# Patient Record
Sex: Female | Born: 1990 | Race: White | Hispanic: No | Marital: Single | Smoking: Current every day smoker
Health system: Southern US, Community
[De-identification: ages and names within clinical notes are randomized; demographics above are authoritative.]

## PROBLEM LIST (undated history)

## (undated) ENCOUNTER — Inpatient Hospital Stay (HOSPITAL_COMMUNITY): Payer: Self-pay

## (undated) DIAGNOSIS — M5430 Sciatica, unspecified side: Secondary | ICD-10-CM

## (undated) DIAGNOSIS — G971 Other reaction to spinal and lumbar puncture: Secondary | ICD-10-CM

## (undated) DIAGNOSIS — T8859XA Other complications of anesthesia, initial encounter: Secondary | ICD-10-CM

## (undated) DIAGNOSIS — T4145XA Adverse effect of unspecified anesthetic, initial encounter: Secondary | ICD-10-CM

## (undated) HISTORY — DX: Other complications of anesthesia, initial encounter: T88.59XA

## (undated) HISTORY — DX: Other reaction to spinal and lumbar puncture: G97.1

## (undated) HISTORY — DX: Adverse effect of unspecified anesthetic, initial encounter: T41.45XA

---
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
## 2013-04-22 ENCOUNTER — Encounter: Payer: Self-pay | Admitting: Obstetrics & Gynecology

## 2013-04-22 ENCOUNTER — Ambulatory Visit (INDEPENDENT_AMBULATORY_CARE_PROVIDER_SITE_OTHER): Payer: Medicaid Other | Admitting: General Practice

## 2013-04-22 DIAGNOSIS — Z3200 Encounter for pregnancy test, result unknown: Secondary | ICD-10-CM

## 2013-04-22 DIAGNOSIS — Z3201 Encounter for pregnancy test, result positive: Secondary | ICD-10-CM

## 2013-04-22 LAB — POCT PREGNANCY, URINE: Preg Test, Ur: POSITIVE — AB

## 2013-05-11 ENCOUNTER — Encounter: Payer: Self-pay | Admitting: Family Medicine

## 2013-05-11 ENCOUNTER — Ambulatory Visit (INDEPENDENT_AMBULATORY_CARE_PROVIDER_SITE_OTHER): Payer: Medicaid Other | Admitting: Family Medicine

## 2013-05-11 VITALS — BP 112/65 | Temp 97.5°F | Ht 64.0 in | Wt 141.5 lb

## 2013-05-11 DIAGNOSIS — Z3490 Encounter for supervision of normal pregnancy, unspecified, unspecified trimester: Secondary | ICD-10-CM

## 2013-05-11 DIAGNOSIS — Z3481 Encounter for supervision of other normal pregnancy, first trimester: Secondary | ICD-10-CM

## 2013-05-11 DIAGNOSIS — O09299 Supervision of pregnancy with other poor reproductive or obstetric history, unspecified trimester: Secondary | ICD-10-CM

## 2013-05-11 LAB — POCT URINALYSIS DIP (DEVICE)
Bilirubin Urine: NEGATIVE
Glucose, UA: NEGATIVE mg/dL
Hgb urine dipstick: NEGATIVE
Nitrite: NEGATIVE
Urobilinogen, UA: 0.2 mg/dL (ref 0.0–1.0)
pH: 8 (ref 5.0–8.0)

## 2013-05-11 NOTE — Progress Notes (Signed)
.    Subjective:    Shawna Michael is a 22 y.o. female being seen today for her obstetrical visit. She is at Unknown gestation. Patient reports nausea, occasional contractions and vomiting. Fetal movement: normal.  Menstrual History: OB History   Grav Para Term Preterm Abortions TAB SAB Ect Mult Living   5 2  2 2  2          Menarche age:  Patient's last menstrual period was 02/12/2013.    The following portions of the patient's history were reviewed and updated as appropriate: allergies, current medications, past family history, past medical history, past social history, past surgical history and problem list.  Review of Systems A comprehensive review of systems was negative except for: Gastrointestinal: positive for nausea and vomiting   Objective:     BP 112/65  Temp(Src) 97.5 F (36.4 C)  Ht 5\' 4"  (1.626 m)  Wt 141 lb 8 oz (64.184 kg)  BMI 24.28 kg/m2  LMP 02/12/2013 Uterine Size:   Pelvic Exam:          Perineum: is normal                Vulva: normal              Vagina:  normal mucosa, normal discharge                     pH:               Cervix: anteverted and no lesions          Wet Prep:               Uterus: normal size             Adnexa: normal adnexa     Bony Pelvis: average     Assessment:    Pregnancy 12 and 4/7 weeks   Plan:    Problem list reviewed and updated. Labs reviewed. AFP3 discussed: requested. Role of ultrasound in pregnancy discussed; fetal survey: requested. Amniocentesis discussed: undecided. Follow up in 4 weeks.

## 2013-05-11 NOTE — Patient Instructions (Addendum)
AFP Maternal This is a routine screen (tests) used to check for fetal abnormalities such as Down syndrome and neural tube defects. Down Syndrome is a chromosomal abnormality, sometimes called Trisomy 70. Neural tube defects are serious birth defects. The brain, spinal cord, or their coverings do not develop completely. Women should be tested in the 15th to 20th week of pregnancy. The msAFP screen involves three or four tests that measure substances found in the blood that make the testing better. During development, AFP levels in fetal blood and amniotic fluid rise until about 12 weeks. The levels then gradually fall until birth. AFP is a protein produce by fetal tissue. AFP crosses the placenta and appears in the maternal blood. A baby with an open neural tube defect has an opening in its spine, head, or abdominal wall that allows higher-than-usual amounts of AFP to pass into the mother's blood. If a screen is positive, more tests are needed to make a diagnosis. These include ultrasound and perhaps amniocentesis (checking the fluid that surrounds the baby). These tests are used to help women and their caregivers make decisions about the management of their pregnancies. In pregnancies where the fetus is carrying the chromosomal defect that results in Down syndrome, the levels of AFP and unconjugated estriol tend to be low and hCG and inhibin A levels high.  PREPARATION FOR TEST Blood is drawn from a vein in your arm usually between the 15th and 20th weeks of pregnancy. Four different tests on your blood are done. These are AFP, hCG, unconjugated estriol, and inhibin A. The combination of tests produces a more accurate result. NORMAL FINDINGS   Adult: less than 40ng/mL or less than 40 mg/L (SI units)  Child younger than1 year: less than 30 ng/mL Ranges are stratified by weeks of gestation and vary among laboratories. Ranges for normal findings may vary among different laboratories and hospitals. You  should always check with your doctor after having lab work or other tests done to discuss the meaning of your test results and whether your values are considered within normal limits. MEANING OF TEST  These are screening tests. Not all fetal abnormalities will give positive test results. Of all women who have positive AFP screening results, only a very small number of them have babies who actually have a neural tube defect or chromosomal abnormality. Your caregiver will go over the test results with you and discuss the importance and meaning of your results, as well as treatment options and the need for additional tests if necessary. OBTAINING THE TEST RESULTS It is your responsibility to obtain your test results. Ask the lab or department performing the test when and how you will get your results. Document Released: 10/14/2004 Document Revised: 12/15/2011 Document Reviewed: 08/26/2008 [facility] Patient Information 2014 [place], [place]. Pregnancy - First Trimester During sexual intercourse, millions of sperm go into the vagina. Only 1 sperm will penetrate and fertilize the female egg while it is in the Fallopian tube. One week later, the fertilized egg implants into the wall of the uterus. An embryo begins to develop into a baby. At 6 to 8 weeks, the eyes and face are formed and the heartbeat can be seen on ultrasound. At the end of 12 weeks (first trimester), all the baby's organs are formed. Now that you are pregnant, you will want to do everything you can to have a healthy baby. Two of the most important things are to get good prenatal care and follow your caregiver's instructions. Prenatal care is  all the medical care you receive before the baby's birth. It is given to prevent, find, and treat problems during the pregnancy and childbirth. PRENATAL EXAMS  During prenatal visits, your weight, blood pressure, and urine are checked. This is done to make sure you are healthy and progressing normally during  the pregnancy.  A pregnant woman should gain 25 to 35 pounds during the pregnancy. However, if you are overweight or underweight, your caregiver will advise you regarding your weight.  Your caregiver will ask and answer questions for you.  Blood work, cervical cultures, other necessary tests, and a Pap test are done during your prenatal exams. These tests are done to check on your health and the probable health of your baby. Tests are strongly recommended and done for HIV with your permission. This is the virus that causes AIDS. These tests are done because medicines can be given to help prevent your baby from being born with this infection should you have been infected without knowing it. Blood work is also used to find out your blood type, previous infections, and follow your blood levels (hemoglobin).  Low hemoglobin (anemia) is common during pregnancy. Iron and vitamins are given to help prevent this. Later in the pregnancy, blood tests for diabetes will be done along with any other tests if any problems develop.  You may need other tests to make sure you and the baby are doing well. CHANGES DURING THE FIRST TRIMESTER  Your body goes through many changes during pregnancy. They vary from person to person. Talk to your caregiver about changes you notice and are concerned about. Changes can include:  Your menstrual period stops.  The egg and sperm carry the genes that determine what you look like. Genes from you and your partner are forming a baby. The female genes determine whether the baby is a boy or a girl.  Your body increases in girth and you may feel bloated.  Feeling sick to your stomach (nauseous) and throwing up (vomiting). If the vomiting is uncontrollable, call your caregiver.  Your breasts will begin to enlarge and become tender.  Your nipples may stick out more and become darker.  The need to urinate more. Painful urination may mean you have a bladder infection.  Tiring  easily.  Loss of appetite.  Cravings for certain kinds of food.  At first, you may gain or lose a couple of pounds.  You may have changes in your emotions from day to day (excited to be pregnant or concerned something may go wrong with the pregnancy and baby).  You may have more vivid and strange dreams. HOME CARE INSTRUCTIONS   It is very important to avoid all smoking, alcohol and non-prescribed drugs during your pregnancy. These affect the formation and growth of the baby. Avoid chemicals while pregnant to ensure the delivery of a healthy infant.  Start your prenatal visits by the 12th week of pregnancy. They are usually scheduled monthly at first, then more often in the last 2 months before delivery. Keep your caregiver's appointments. Follow your caregiver's instructions regarding medicine use, blood and lab tests, exercise, and diet.  During pregnancy, you are providing food for you and your baby. Eat regular, well-balanced meals. Choose foods such as meat, fish, milk and other low fat dairy products, vegetables, fruits, and whole-grain breads and cereals. Your caregiver will tell you of the ideal weight gain.  You can help morning sickness by keeping soda crackers at the bedside. Eat a couple before arising  in the morning. You may want to use the crackers without salt on them.  Eating 4 to 5 small meals rather than 3 large meals a day also may help the nausea and vomiting.  Drinking liquids between meals instead of during meals also seems to help nausea and vomiting.  A physical sexual relationship may be continued throughout pregnancy if there are no other problems. Problems may be early (premature) leaking of amniotic fluid from the membranes, vaginal bleeding, or belly (abdominal) pain.  Exercise regularly if there are no restrictions. Check with your caregiver or physical therapist if you are unsure of the safety of some of your exercises. Greater weight gain will occur in the  last 2 trimesters of pregnancy. Exercising will help:  Control your weight.  Keep you in shape.  Prepare you for labor and delivery.  Help you lose your pregnancy weight after you deliver your baby.  Wear a good support or jogging bra for breast tenderness during pregnancy. This may help if worn during sleep too.  Ask when prenatal classes are available. Begin classes when they are offered.  Do not use hot tubs, steam rooms, or saunas.  Wear your seat belt when driving. This protects you and your baby if you are in an accident.  Avoid raw meat, uncooked cheese, cat litter boxes, and soil used by cats throughout the pregnancy. These carry germs that can cause birth defects in the baby.  The first trimester is a good time to visit your dentist for your dental health. Getting your teeth cleaned is okay. Use a softer toothbrush and brush gently during pregnancy.  Ask for help if you have financial, counseling, or nutritional needs during pregnancy. Your caregiver will be able to offer counseling for these needs as well as refer you for other special needs.  Do not take any medicines or herbs unless told by your caregiver.  Inform your caregiver if there is any mental or physical domestic violence.  Make a list of emergency phone numbers of family, friends, hospital, and police and fire departments.  Write down your questions. Take them to your prenatal visit.  Do not douche.  Do not cross your legs.  If you have to stand for long periods of time, rotate you feet or take small steps in a circle.  You may have more vaginal secretions that may require a sanitary pad. Do not use tampons or scented sanitary pads. MEDICINES AND DRUG USE IN PREGNANCY  Take prenatal vitamins as directed. The vitamin should contain 1 milligram of folic acid. Keep all vitamins out of reach of children. Only a couple vitamins or tablets containing iron may be fatal to a baby or young child when  ingested.  Avoid use of all medicines, including herbs, over-the-counter medicines, not prescribed or suggested by your caregiver. Only take over-the-counter or prescription medicines for pain, discomfort, or fever as directed by your caregiver. Do not use aspirin, ibuprofen, or naproxen unless directed by your caregiver.  Let your caregiver also know about herbs you may be using.  Alcohol is related to a number of birth defects. This includes fetal alcohol syndrome. All alcohol, in any form, should be avoided completely. Smoking will cause low birth rate and premature babies.  Street or illegal drugs are very harmful to the baby. They are absolutely forbidden. A baby born to an addicted mother will be addicted at birth. The baby will go through the same withdrawal an adult does.  Let your caregiver know  about any medicines that you have to take and for what reason you take them. SEEK MEDICAL CARE IF:  You have any concerns or worries during your pregnancy. It is better to call with your questions if you feel they cannot wait, rather than worry about them. SEEK IMMEDIATE MEDICAL CARE IF:   An unexplained oral temperature above 102 F (38.9 C) develops, or as your caregiver suggests.  You have leaking of fluid from the vagina (birth canal). If leaking membranes are suspected, take your temperature and inform your caregiver of this when you call.  There is vaginal spotting or bleeding. Notify your caregiver of the amount and how many pads are used.  You develop a bad smelling vaginal discharge with a change in the color.  You continue to feel sick to your stomach (nauseated) and have no relief from remedies suggested. You vomit blood or coffee ground-like materials.  You lose more than 2 pounds of weight in 1 week.  You gain more than 2 pounds of weight in 1 week and you notice swelling of your face, hands, feet, or legs.  You gain 5 pounds or more in 1 week (even if you do not have  swelling of your hands, face, legs, or feet).  You get exposed to [place] measles and have never had them.  You are exposed to fifth disease or chickenpox.  You develop belly (abdominal) pain. Round ligament discomfort is a common non-cancerous (benign) cause of abdominal pain in pregnancy. Your caregiver still must evaluate this.  You develop headache, fever, diarrhea, pain with urination, or shortness of breath.  You fall or are in a car accident or have any kind of trauma.  There is mental or physical violence in your home. Document Released: 09/16/2001 Document Revised: 06/16/2012 Document Reviewed: 03/20/2009 [facility] Patient Information 2014 [place], [place].  Pregnancy - First Trimester During sexual intercourse, millions of sperm go into the vagina. Only 1 sperm will penetrate and fertilize the female egg while it is in the Fallopian tube. One week later, the fertilized egg implants into the wall of the uterus. An embryo begins to develop into a baby. At 6 to 8 weeks, the eyes and face are formed and the heartbeat can be seen on ultrasound. At the end of 12 weeks (first trimester), all the baby's organs are formed. Now that you are pregnant, you will want to do everything you can to have a healthy baby. Two of the most important things are to get good prenatal care and follow your caregiver's instructions. Prenatal care is all the medical care you receive before the baby's birth. It is given to prevent, find, and treat problems during the pregnancy and childbirth. PRENATAL EXAMS  During prenatal visits, your weight, blood pressure, and urine are checked. This is done to make sure you are healthy and progressing normally during the pregnancy.  A pregnant woman should gain 25 to 35 pounds during the pregnancy. However, if you are overweight or underweight, your caregiver will advise you regarding your weight.  Your caregiver will ask and answer questions for you.  Blood work, cervical  cultures, other necessary tests, and a Pap test are done during your prenatal exams. These tests are done to check on your health and the probable health of your baby. Tests are strongly recommended and done for HIV with your permission. This is the virus that causes AIDS. These tests are done because medicines can be given to help prevent your baby from being born  with this infection should you have been infected without knowing it. Blood work is also used to find out your blood type, previous infections, and follow your blood levels (hemoglobin).  Low hemoglobin (anemia) is common during pregnancy. Iron and vitamins are given to help prevent this. Later in the pregnancy, blood tests for diabetes will be done along with any other tests if any problems develop.  You may need other tests to make sure you and the baby are doing well. CHANGES DURING THE FIRST TRIMESTER  Your body goes through many changes during pregnancy. They vary from person to person. Talk to your caregiver about changes you notice and are concerned about. Changes can include:  Your menstrual period stops.  The egg and sperm carry the genes that determine what you look like. Genes from you and your partner are forming a baby. The female genes determine whether the baby is a boy or a girl.  Your body increases in girth and you may feel bloated.  Feeling sick to your stomach (nauseous) and throwing up (vomiting). If the vomiting is uncontrollable, call your caregiver.  Your breasts will begin to enlarge and become tender.  Your nipples may stick out more and become darker.  The need to urinate more. Painful urination may mean you have a bladder infection.  Tiring easily.  Loss of appetite.  Cravings for certain kinds of food.  At first, you may gain or lose a couple of pounds.  You may have changes in your emotions from day to day (excited to be pregnant or concerned something may go wrong with the pregnancy and  baby).  You may have more vivid and strange dreams. HOME CARE INSTRUCTIONS   It is very important to avoid all smoking, alcohol and non-prescribed drugs during your pregnancy. These affect the formation and growth of the baby. Avoid chemicals while pregnant to ensure the delivery of a healthy infant.  Start your prenatal visits by the 12th week of pregnancy. They are usually scheduled monthly at first, then more often in the last 2 months before delivery. Keep your caregiver's appointments. Follow your caregiver's instructions regarding medicine use, blood and lab tests, exercise, and diet.  During pregnancy, you are providing food for you and your baby. Eat regular, well-balanced meals. Choose foods such as meat, fish, milk and other low fat dairy products, vegetables, fruits, and whole-grain breads and cereals. Your caregiver will tell you of the ideal weight gain.  You can help morning sickness by keeping soda crackers at the bedside. Eat a couple before arising in the morning. You may want to use the crackers without salt on them.  Eating 4 to 5 small meals rather than 3 large meals a day also may help the nausea and vomiting.  Drinking liquids between meals instead of during meals also seems to help nausea and vomiting.  A physical sexual relationship may be continued throughout pregnancy if there are no other problems. Problems may be early (premature) leaking of amniotic fluid from the membranes, vaginal bleeding, or belly (abdominal) pain.  Exercise regularly if there are no restrictions. Check with your caregiver or physical therapist if you are unsure of the safety of some of your exercises. Greater weight gain will occur in the last 2 trimesters of pregnancy. Exercising will help:  Control your weight.  Keep you in shape.  Prepare you for labor and delivery.  Help you lose your pregnancy weight after you deliver your baby.  Wear a good support or  jogging bra for breast  tenderness during pregnancy. This may help if worn during sleep too.  Ask when prenatal classes are available. Begin classes when they are offered.  Do not use hot tubs, steam rooms, or saunas.  Wear your seat belt when driving. This protects you and your baby if you are in an accident.  Avoid raw meat, uncooked cheese, cat litter boxes, and soil used by cats throughout the pregnancy. These carry germs that can cause birth defects in the baby.  The first trimester is a good time to visit your dentist for your dental health. Getting your teeth cleaned is okay. Use a softer toothbrush and brush gently during pregnancy.  Ask for help if you have financial, counseling, or nutritional needs during pregnancy. Your caregiver will be able to offer counseling for these needs as well as refer you for other special needs.  Do not take any medicines or herbs unless told by your caregiver.  Inform your caregiver if there is any mental or physical domestic violence.  Make a list of emergency phone numbers of family, friends, hospital, and police and fire departments.  Write down your questions. Take them to your prenatal visit.  Do not douche.  Do not cross your legs.  If you have to stand for long periods of time, rotate you feet or take small steps in a circle.  You may have more vaginal secretions that may require a sanitary pad. Do not use tampons or scented sanitary pads. MEDICINES AND DRUG USE IN PREGNANCY  Take prenatal vitamins as directed. The vitamin should contain 1 milligram of folic acid. Keep all vitamins out of reach of children. Only a couple vitamins or tablets containing iron may be fatal to a baby or young child when ingested.  Avoid use of all medicines, including herbs, over-the-counter medicines, not prescribed or suggested by your caregiver. Only take over-the-counter or prescription medicines for pain, discomfort, or fever as directed by your caregiver. Do not use aspirin,  ibuprofen, or naproxen unless directed by your caregiver.  Let your caregiver also know about herbs you may be using.  Alcohol is related to a number of birth defects. This includes fetal alcohol syndrome. All alcohol, in any form, should be avoided completely. Smoking will cause low birth rate and premature babies.  Street or illegal drugs are very harmful to the baby. They are absolutely forbidden. A baby born to an addicted mother will be addicted at birth. The baby will go through the same withdrawal an adult does.  Let your caregiver know about any medicines that you have to take and for what reason you take them. SEEK MEDICAL CARE IF:  You have any concerns or worries during your pregnancy. It is better to call with your questions if you feel they cannot wait, rather than worry about them. SEEK IMMEDIATE MEDICAL CARE IF:   An unexplained oral temperature above 102 F (38.9 C) develops, or as your caregiver suggests.  You have leaking of fluid from the vagina (birth canal). If leaking membranes are suspected, take your temperature and inform your caregiver of this when you call.  There is vaginal spotting or bleeding. Notify your caregiver of the amount and how many pads are used.  You develop a bad smelling vaginal discharge with a change in the color.  You continue to feel sick to your stomach (nauseated) and have no relief from remedies suggested. You vomit blood or coffee ground-like materials.  You lose more than 2  pounds of weight in 1 week.  You gain more than 2 pounds of weight in 1 week and you notice swelling of your face, hands, feet, or legs.  You gain 5 pounds or more in 1 week (even if you do not have swelling of your hands, face, legs, or feet).  You get exposed to [place] measles and have never had them.  You are exposed to fifth disease or chickenpox.  You develop belly (abdominal) pain. Round ligament discomfort is a common non-cancerous (benign) cause of  abdominal pain in pregnancy. Your caregiver still must evaluate this.  You develop headache, fever, diarrhea, pain with urination, or shortness of breath.  You fall or are in a car accident or have any kind of trauma.  There is mental or physical violence in your home. Document Released: 09/16/2001 Document Revised: 06/16/2012 Document Reviewed: 03/20/2009 [facility] Patient Information 2014 [place], [place].  Thank you for enrolling in MyChart. Please follow the instructions below to securely access your online medical record. MyChart allows you to send messages to your doctor, view your test results, manage appointments, and more.   How Do I Sign Up? 1. In your Internet browser, go to [employer] and enter [email or web address]. 2. Click on the Sign Up Now link in the Sign In box. You will see the New Member Sign Up page. 3. Enter your MyChart Access Code exactly as it appears below. You will not need to use this code after you've completed the sign-up process. If you do not sign up before the expiration date, you must request a new code. MyChart Access Code: [email or web address] Expires: 06/10/2013 10:53 AM  4. Enter your Social Security Number ([email or web address]) and Date of Birth (mm/dd/yyyy) as indicated and click Submit. You will be taken to the next sign-up page. 5. Create a MyChart ID. This will be your MyChart login ID and cannot be changed, so think of one that is secure and easy to remember. 6. Create a MyChart password. You can change your password at any time. 7. Enter your Password Reset Question and Answer. This can be used at a later time if you forget your password.  8. Enter your e-mail address. You will receive e-mail notification when new information is available in MyChart. 9. Click Sign Up. You can now view your medical record.   Additional Information Remember, MyChart is NOT to be used for urgent needs. For medical emergencies, dial 911.

## 2013-05-11 NOTE — Progress Notes (Signed)
P=[id number],  Here for new ob.  Went to [facility] in late June/early July had first ultrasound because having severe pelvic pain. Was taking zofran, has ran out and would like a refill for continued n&V. C/o cramping everyday, less than when went to [facility]. Given new patient information and discussed bmi and appropriate weight gain.

## 2013-05-11 NOTE — Progress Notes (Signed)
I spoke with and examined patient and agree with resident's note and plan of care.  Tawana Scale, MD OB Fellow 05/11/2013 11:06 AM  Shawna Michael is a 22 y.o. [id number] at [redacted]w[redacted]d by LMP but unsure of dating here for ROB visit.  Plan to do dating [place] after having genetic abnormalities with last infant. Will do quad screen and additional testing based on results.  Discussed with Patient:  - New OB labs from last visit were wnl. - RTC for any VB, regular, painful cramps/ctxs occurring at a rate of >2/10 min, fever (100.5 or higher), n/v/d, any pain that is unresolving or worsening. - Routine precautions(SAB, depression, infection s/s) - RTC in 4 weeks for next appt.  To Do: 1. Dating [place], call for results and plan for quad screen

## 2013-05-12 LAB — OBSTETRIC PANEL
Basophils Absolute: 0 10*3/uL (ref 0.0–0.1)
Basophils Relative: 0 % (ref 0–1)
Eosinophils Absolute: 0 10*3/uL (ref 0.0–0.7)
Hemoglobin: 11.8 g/dL — ABNORMAL LOW (ref 12.0–15.0)
Hepatitis B Surface Ag: NEGATIVE
MCH: 30 pg (ref 26.0–34.0)
MCHC: 34.4 g/dL (ref 30.0–36.0)
Neutro Abs: 6.2 10*3/uL (ref 1.7–7.7)
Neutrophils Relative %: 73 % (ref 43–77)
Platelets: 243 10*3/uL (ref 150–400)
RDW: 14 % (ref 11.5–15.5)

## 2013-05-12 LAB — GC/CHLAMYDIA PROBE AMP: GC Probe RNA: NEGATIVE

## 2013-05-13 ENCOUNTER — Ambulatory Visit (HOSPITAL_COMMUNITY)
Admission: RE | Admit: 2013-05-13 | Discharge: 2013-05-13 | Disposition: A | Discharge status: Home / Self Care | Payer: Medicaid Other | Source: Ambulatory Visit | Attending: Family Medicine | Admitting: Family Medicine

## 2013-05-13 ENCOUNTER — Other Ambulatory Visit: Payer: Self-pay | Admitting: Family Medicine

## 2013-05-13 ENCOUNTER — Encounter: Payer: Self-pay | Admitting: *Deleted

## 2013-05-13 DIAGNOSIS — Z3490 Encounter for supervision of normal pregnancy, unspecified, unspecified trimester: Secondary | ICD-10-CM

## 2013-05-13 DIAGNOSIS — O09299 Supervision of pregnancy with other poor reproductive or obstetric history, unspecified trimester: Secondary | ICD-10-CM | POA: Insufficient documentation

## 2013-05-13 DIAGNOSIS — Z3481 Encounter for supervision of other normal pregnancy, first trimester: Secondary | ICD-10-CM

## 2013-05-13 DIAGNOSIS — O26849 Uterine size-date discrepancy, unspecified trimester: Secondary | ICD-10-CM | POA: Insufficient documentation

## 2013-05-13 DIAGNOSIS — Z3689 Encounter for other specified antenatal screening: Secondary | ICD-10-CM | POA: Insufficient documentation

## 2013-05-16 LAB — CULTURE, OB URINE: Colony Count: 85000

## 2013-05-18 ENCOUNTER — Telehealth: Payer: Self-pay | Admitting: Family Medicine

## 2013-05-18 DIAGNOSIS — Z3490 Encounter for supervision of normal pregnancy, unspecified, unspecified trimester: Secondary | ICD-10-CM

## 2013-05-20 ENCOUNTER — Telehealth: Payer: Self-pay | Admitting: *Deleted

## 2013-05-20 NOTE — Telephone Encounter (Signed)
05/18/2013 7:56 AM Minta Balsam, MD [facility] Admin Pool Patient Calls    Comment: Howdy,  Please call pt and inform her that her due date is 10/22/2012 and she needs to come to lab to have the tests drawn. Quad screen entered. Please have her come ASAP to have labs drawn and schedule her follow up. Also entered Anatomy scan for 19 weeks.   Cheers,  Hinda Lenis

## 2013-05-20 NOTE — Telephone Encounter (Signed)
Called Shawna Michael and we discussed that she has already had prenatal labs drawn, needs quad screen drawn, had ultrasound done she states which they told her changed her due date- . She thinks she needs to come in sooner because she has had history of problems with pregnancy and may need to make some decisions if quad is abnormal.  Informed her we would clarify with her doctor and call her back and schedule her for quad asap.

## 2013-05-20 NOTE — Telephone Encounter (Signed)
Pt left message stating that she had [place] on 8/8 which showed that she is further along in her pregnancy than was previously thought. She wants to know if she needs a sooner appt.  I called pt and informed her that appt has been made for her on 8/21 @ 1245 for f/u prenatal visit. Her due date had been changed to 10/22/13 based on the [place]. She will also need f/u US to further evaluate some anatomy which was not visualized well last week. The appt has been made for 05/30/13 @ 1:00. She would like to get her Quad screen done as soon as possible because of past OB history. She will come in on 8/18 @ 1000 for Quad only. Pt agreed and voiced understanding.

## 2013-05-20 NOTE — Telephone Encounter (Signed)
Sent to [employer], copied and sent to clinical pool

## 2013-05-23 ENCOUNTER — Other Ambulatory Visit (INDEPENDENT_AMBULATORY_CARE_PROVIDER_SITE_OTHER): Payer: Medicaid Other

## 2013-05-23 DIAGNOSIS — O09299 Supervision of pregnancy with other poor reproductive or obstetric history, unspecified trimester: Secondary | ICD-10-CM

## 2013-05-26 ENCOUNTER — Encounter: Payer: Self-pay | Admitting: Obstetrics and Gynecology

## 2013-05-26 ENCOUNTER — Ambulatory Visit (INDEPENDENT_AMBULATORY_CARE_PROVIDER_SITE_OTHER): Payer: Medicaid Other | Admitting: Obstetrics and Gynecology

## 2013-05-26 VITALS — BP 96/59 | Wt 143.5 lb

## 2013-05-26 DIAGNOSIS — O26892 Other specified pregnancy related conditions, second trimester: Secondary | ICD-10-CM

## 2013-05-26 DIAGNOSIS — R300 Dysuria: Secondary | ICD-10-CM

## 2013-05-26 DIAGNOSIS — O09299 Supervision of pregnancy with other poor reproductive or obstetric history, unspecified trimester: Secondary | ICD-10-CM

## 2013-05-26 DIAGNOSIS — N390 Urinary tract infection, site not specified: Secondary | ICD-10-CM

## 2013-05-26 DIAGNOSIS — O9989 Other specified diseases and conditions complicating pregnancy, childbirth and the puerperium: Secondary | ICD-10-CM

## 2013-05-26 DIAGNOSIS — O09292 Supervision of pregnancy with other poor reproductive or obstetric history, second trimester: Secondary | ICD-10-CM | POA: Insufficient documentation

## 2013-05-26 LAB — POCT URINALYSIS DIP (DEVICE)
Bilirubin Urine: NEGATIVE
Glucose, UA: NEGATIVE mg/dL
Hgb urine dipstick: NEGATIVE
Ketones, ur: NEGATIVE mg/dL
Nitrite: NEGATIVE
Specific Gravity, Urine: 1.015 (ref 1.005–1.030)
pH: 5.5 (ref 5.0–8.0)

## 2013-05-26 NOTE — Patient Instructions (Signed)
Pregnancy - Second Trimester The second trimester of pregnancy (3 to 6 months) is a period of rapid growth for you and your baby. At the end of the sixth month, your baby is about 9 inches long and weighs 1 1/2 pounds. You will begin to feel the baby move between 18 and 20 weeks of the pregnancy. This is called quickening. Weight gain is faster. A clear fluid (colostrum) may leak out of your breasts. You may feel small contractions of the womb (uterus). This is known as false labor or Braxton-Hicks contractions. This is like a practice for labor when the baby is ready to be born. Usually, the problems with morning sickness have usually passed by the end of your first trimester. Some women develop small dark blotches (called cholasma, mask of pregnancy) on their face that usually goes away after the baby is born. Exposure to the sun makes the blotches worse. Acne may also develop in some pregnant women and pregnant women who have acne, may find that it goes away. PRENATAL EXAMS  Blood work may continue to be done during prenatal exams. These tests are done to check on your health and the probable health of your baby. Blood work is used to follow your blood levels (hemoglobin). Anemia (low hemoglobin) is common during pregnancy. Iron and vitamins are given to help prevent this. You will also be checked for diabetes between 24 and 28 weeks of the pregnancy. Some of the previous blood tests may be repeated.  The size of the uterus is measured during each visit. This is to make sure that the baby is continuing to grow properly according to the dates of the pregnancy.  Your blood pressure is checked every prenatal visit. This is to make sure you are not getting toxemia.  Your urine is checked to make sure you do not have an infection, diabetes or protein in the urine.  Your weight is checked often to make sure gains are happening at the suggested rate. This is to ensure that both you and your baby are  growing normally.  Sometimes, an ultrasound is performed to confirm the proper growth and development of the baby. This is a test which bounces harmless sound waves off the baby so your caregiver can more accurately determine due dates. Sometimes, a test is done on the amniotic fluid surrounding the baby. This test is called an amniocentesis. The amniotic fluid is obtained by sticking a needle into the belly (abdomen). This is done to check the chromosomes in instances where there is a concern about possible genetic problems with the baby. It is also sometimes done near the end of pregnancy if an early delivery is required. In this case, it is done to help make sure the baby's lungs are mature enough for the baby to live outside of the womb. CHANGES OCCURING IN THE SECOND TRIMESTER OF PREGNANCY Your body goes through many changes during pregnancy. They vary from person to person. Talk to your caregiver about changes you notice that you are concerned about.  During the second trimester, you will likely have an increase in your appetite. It is normal to have cravings for certain foods. This varies from person to person and pregnancy to pregnancy.  Your lower abdomen will begin to bulge.  You may have to urinate more often because the uterus and baby are pressing on your bladder. It is also common to get more bladder infections during pregnancy. You can help this by drinking lots of fluids   and emptying your bladder before and after intercourse.  You may begin to get stretch marks on your hips, abdomen, and breasts. These are normal changes in the body during pregnancy. There are no exercises or medicines to take that prevent this change.  You may begin to develop swollen and bulging veins (varicose veins) in your legs. Wearing support hose, elevating your feet for 15 minutes, 3 to 4 times a day and limiting salt in your diet helps lessen the problem.  Heartburn may develop as the uterus grows and  pushes up against the stomach. Antacids recommended by your caregiver helps with this problem. Also, eating smaller meals 4 to 5 times a day helps.  Constipation can be treated with a stool softener or adding bulk to your diet. Drinking lots of fluids, and eating vegetables, fruits, and whole grains are helpful.  Exercising is also helpful. If you have been very active up until your pregnancy, most of these activities can be continued during your pregnancy. If you have been less active, it is helpful to start an exercise program such as walking.  Hemorrhoids may develop at the end of the second trimester. Warm sitz baths and hemorrhoid cream recommended by your caregiver helps hemorrhoid problems.  Backaches may develop during this time of your pregnancy. Avoid heavy lifting, wear low heal shoes, and practice good posture to help with backache problems.  Some pregnant women develop tingling and numbness of their hand and fingers because of swelling and tightening of ligaments in the wrist (carpel tunnel syndrome). This goes away after the baby is born.  As your breasts enlarge, you may have to get a bigger bra. Get a comfortable, cotton, support bra. Do not get a nursing bra until the last month of the pregnancy if you will be nursing the baby.  You may get a dark line from your belly button to the pubic area called the linea nigra.  You may develop rosy cheeks because of increase blood flow to the face.  You may develop spider looking lines of the face, neck, arms, and chest. These go away after the baby is born. HOME CARE INSTRUCTIONS   It is extremely important to avoid all smoking, herbs, alcohol, and unprescribed drugs during your pregnancy. These chemicals affect the formation and growth of the baby. Avoid these chemicals throughout the pregnancy to ensure the delivery of a healthy infant.  Most of your home care instructions are the same as suggested for the first trimester of your  pregnancy. Keep your caregiver's appointments. Follow your caregiver's instructions regarding medicine use, exercise, and diet.  During pregnancy, you are providing food for you and your baby. Continue to eat regular, well-balanced meals. Choose foods such as meat, fish, milk and other low fat dairy products, vegetables, fruits, and whole-grain breads and cereals. Your caregiver will tell you of the ideal weight gain.  A physical sexual relationship may be continued up until near the end of pregnancy if there are no other problems. Problems could include early (premature) leaking of amniotic fluid from the membranes, vaginal bleeding, abdominal pain, or other medical or pregnancy problems.  Exercise regularly if there are no restrictions. Check with your caregiver if you are unsure of the safety of some of your exercises. The greatest weight gain will occur in the last 2 trimesters of pregnancy. Exercise will help you:  Control your weight.  Get you in shape for labor and delivery.  Lose weight after you have the baby.  Wear   a good support or jogging bra for breast tenderness during pregnancy. This may help if worn during sleep. Pads or tissues may be used in the bra if you are leaking colostrum.  Do not use hot tubs, steam rooms or saunas throughout the pregnancy.  Wear your seat belt at all times when driving. This protects you and your baby if you are in an accident.  Avoid raw meat, uncooked cheese, cat litter boxes, and soil used by cats. These carry germs that can cause birth defects in the baby.  The second trimester is also a good time to visit your dentist for your dental health if this has not been done yet. Getting your teeth cleaned is okay. Use a soft toothbrush. Brush gently during pregnancy.  It is easier to leak urine during pregnancy. Tightening up and strengthening the pelvic muscles will help with this problem. Practice stopping your urination while you are going to the  bathroom. These are the same muscles you need to strengthen. It is also the muscles you would use as if you were trying to stop from passing gas. You can practice tightening these muscles up 10 times a set and repeating this about 3 times per day. Once you know what muscles to tighten up, do not perform these exercises during urination. It is more likely to contribute to an infection by backing up the urine.  Ask for help if you have financial, counseling, or nutritional needs during pregnancy. Your caregiver will be able to offer counseling for these needs as well as refer you for other special needs.  Your skin may become oily. If so, wash your face with mild soap, use non-greasy moisturizer and oil or cream based makeup. MEDICINES AND DRUG USE IN PREGNANCY  Take prenatal vitamins as directed. The vitamin should contain 1 milligram of folic acid. Keep all vitamins out of reach of children. Only a couple vitamins or tablets containing iron may be fatal to a baby or young child when ingested.  Avoid use of all medicines, including herbs, over-the-counter medicines, not prescribed or suggested by your caregiver. Only take over-the-counter or prescription medicines for pain, discomfort, or fever as directed by your caregiver. Do not use aspirin.  Let your caregiver also know about herbs you may be using.  Alcohol is related to a number of birth defects. This includes fetal alcohol syndrome. All alcohol, in any form, should be avoided completely. Smoking will cause low birth rate and premature babies.  Street or illegal drugs are very harmful to the baby. They are absolutely forbidden. A baby born to an addicted mother will be addicted at birth. The baby will go through the same withdrawal an adult does. SEEK MEDICAL CARE IF:  You have any concerns or worries during your pregnancy. It is better to call with your questions if you feel they cannot wait, rather than worry about them. SEEK IMMEDIATE  MEDICAL CARE IF:   An unexplained oral temperature above 102 F (38.9 C) develops, or as your caregiver suggests.  You have leaking of fluid from the vagina (birth canal). If leaking membranes are suspected, take your temperature and tell your caregiver of this when you call.  There is vaginal spotting, bleeding, or passing clots. Tell your caregiver of the amount and how many pads are used. Light spotting in pregnancy is common, especially following intercourse.  You develop a bad smelling vaginal discharge with a change in the color from clear to white.  You continue to feel   sick to your stomach (nauseated) and have no relief from remedies suggested. You vomit blood or coffee ground-like materials.  You lose more than 2 pounds of weight or gain more than 2 pounds of weight over 1 week, or as suggested by your caregiver.  You notice swelling of your face, hands, feet, or legs.  You get exposed to German measles and have never had them.  You are exposed to fifth disease or chickenpox.  You develop belly (abdominal) pain. Round ligament discomfort is a common non-cancerous (benign) cause of abdominal pain in pregnancy. Your caregiver still must evaluate you.  You develop a bad headache that does not go away.  You develop fever, diarrhea, pain with urination, or shortness of breath.  You develop visual problems, blurry, or double vision.  You fall or are in a car accident or any kind of trauma.  There is mental or physical violence at home. Document Released: 09/16/2001 Document Revised: 06/16/2012 Document Reviewed: 03/21/2009 [employer] Patient Information 2014 [employer].  

## 2013-05-26 NOTE — Progress Notes (Signed)
Pulse: 78 Pt states that she has a uti. She has some abx at home so she took one.

## 2013-05-26 NOTE — Progress Notes (Signed)
Dysuria x 1 d, no systemic sx. Took 1 Keflex left from when she had UTI in prior pregnancy. Urine dip>tr LE. C&S sent. Meanwhile will continue Keflex 500 qid.[place] reviewed> F/U anat scheduled.

## 2013-05-28 LAB — URINE CULTURE: Colony Count: 35000

## 2013-05-30 ENCOUNTER — Ambulatory Visit (HOSPITAL_COMMUNITY)
Admission: RE | Admit: 2013-05-30 | Discharge: 2013-05-30 | Disposition: A | Discharge status: Home / Self Care | Payer: Medicaid Other | Source: Ambulatory Visit | Attending: Family Medicine | Admitting: Family Medicine

## 2013-05-30 DIAGNOSIS — Z3689 Encounter for other specified antenatal screening: Secondary | ICD-10-CM | POA: Insufficient documentation

## 2013-05-30 DIAGNOSIS — Z3490 Encounter for supervision of normal pregnancy, unspecified, unspecified trimester: Secondary | ICD-10-CM

## 2013-05-31 MED ORDER — CEPHALEXIN 500 MG PO CAPS: 500.0000 mg | ORAL_CAPSULE | Freq: Three times a day (TID) | ORAL | Status: DC

## 2013-05-31 NOTE — Addendum Note (Signed)
Addended by: Danae Orleans on: 05/31/2013 07:39 PM   Modules accepted: Orders

## 2013-06-02 ENCOUNTER — Telehealth: Payer: Self-pay | Admitting: General Practice

## 2013-06-02 DIAGNOSIS — N390 Urinary tract infection, site not specified: Secondary | ICD-10-CM

## 2013-06-02 MED ORDER — CEPHALEXIN 500 MG PO CAPS: 500.0000 mg | ORAL_CAPSULE | Freq: Four times a day (QID) | ORAL | Status: DC

## 2013-06-02 NOTE — Telephone Encounter (Signed)
Called patient and informed her of medication and asked what pharmacy she would like to use. Patient stated the [facility] [place]. Told patient I would send Rx in now. Patient verbalized understanding and had no further questions

## 2013-06-02 NOTE — Telephone Encounter (Signed)
Message copied by Kathee Delton on Thu Jun 02, 2013  5:00 PM ------      Message from: POE, DEIRDRE C      Created: Tue May 31, 2013  7:34 PM       This pt is taking some Keflex she had in the past. Her culture is positive. I tried to send Keflex 500 qid x 10 days but message said it won't e prescribe. Please call and see if the Rx went through. ------

## 2013-06-03 ENCOUNTER — Encounter: Payer: Self-pay | Admitting: *Deleted

## 2013-06-03 DIAGNOSIS — O09299 Supervision of pregnancy with other poor reproductive or obstetric history, unspecified trimester: Secondary | ICD-10-CM

## 2013-06-05 ENCOUNTER — Inpatient Hospital Stay (HOSPITAL_COMMUNITY)
Admission: AD | Admit: 2013-06-05 | Discharge: 2013-06-05 | Disposition: A | Discharge status: Home / Self Care | Payer: Medicaid Other | Source: Ambulatory Visit | Attending: Obstetrics & Gynecology | Admitting: Obstetrics & Gynecology

## 2013-06-05 ENCOUNTER — Encounter (HOSPITAL_COMMUNITY): Payer: Self-pay | Admitting: *Deleted

## 2013-06-05 DIAGNOSIS — A499 Bacterial infection, unspecified: Secondary | ICD-10-CM | POA: Insufficient documentation

## 2013-06-05 DIAGNOSIS — B9689 Other specified bacterial agents as the cause of diseases classified elsewhere: Secondary | ICD-10-CM | POA: Insufficient documentation

## 2013-06-05 DIAGNOSIS — O368190 Decreased fetal movements, unspecified trimester, not applicable or unspecified: Secondary | ICD-10-CM | POA: Insufficient documentation

## 2013-06-05 DIAGNOSIS — O09299 Supervision of pregnancy with other poor reproductive or obstetric history, unspecified trimester: Secondary | ICD-10-CM

## 2013-06-05 DIAGNOSIS — N760 Acute vaginitis: Secondary | ICD-10-CM | POA: Insufficient documentation

## 2013-06-05 DIAGNOSIS — R109 Unspecified abdominal pain: Secondary | ICD-10-CM | POA: Insufficient documentation

## 2013-06-05 DIAGNOSIS — O209 Hemorrhage in early pregnancy, unspecified: Secondary | ICD-10-CM | POA: Insufficient documentation

## 2013-06-05 DIAGNOSIS — O2390 Unspecified genitourinary tract infection in pregnancy, unspecified trimester: Secondary | ICD-10-CM

## 2013-06-05 LAB — WET PREP, GENITAL
Trich, Wet Prep: NONE SEEN
Yeast Wet Prep HPF POC: NONE SEEN

## 2013-06-05 LAB — URINALYSIS, ROUTINE W REFLEX MICROSCOPIC
Bilirubin Urine: NEGATIVE
Nitrite: NEGATIVE
Specific Gravity, Urine: 1.02 (ref 1.005–1.030)
Urobilinogen, UA: 0.2 mg/dL (ref 0.0–1.0)
pH: 7.5 (ref 5.0–8.0)

## 2013-06-05 LAB — URINE MICROSCOPIC-ADD ON

## 2013-06-05 MED ORDER — METRONIDAZOLE 500 MG PO TABS: 500.0000 mg | ORAL_TABLET | Freq: Two times a day (BID) | ORAL | Status: DC

## 2013-06-05 NOTE — MAU Note (Signed)
Pt reports fetal movement less than usual for the past 3 days. Also c/o vaginal bleeding since last night. Dx with UTI last week.

## 2013-06-05 NOTE — MAU Provider Note (Signed)
History     CSN: [id number]  Arrival date and time: 06/05/13 4098  First contact with patient: 1000  None     Chief Complaint  Patient presents with  . Vaginal Bleeding  . Decreased Fetal Movement   HPI  [name] is a 22 y.o. female [phone] at [redacted]w[redacted]d who presents with vaginal bleeding that she noticed one time last night. The bleeding was bright red and it was a very small gush. She also feels like the baby was not moving like he normally moves. 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 The patient is being seen down stairs in the clinic for care and recently had an anatomy scan that showed an echogenic focus in LV which is a soft marker for trisomy 21. The patient and her significant other do not feel they are being fully informed about the information that was found on the [place]. Currently the patient reports good fetal movement, denies LOF, vaginal bleeding, vaginal itching/burning,h/a, dizziness, n/v, or fever/chills.  She is currently taking medication as prescribed for a UTI and has no concerns about this. Last sexual intercourse was >1 week ago.  She has an appointment on Wednesday Sept 3, 2014 downstairs in the clinic.  OB History   Grav Para Term Preterm Abortions TAB SAB Ect Mult Living   5 2  2 2  2          Past Medical History  Diagnosis Date  . Complication of anesthesia   . Spinal headache     Past Surgical History  Procedure Laterality Date  . Mandible fracture surgery  2003    Family History  Problem Relation Age of Onset  . Adopted: Yes    History  Substance Use Topics  . Smoking status: Current Every Day Smoker -- 0.25 packs/day  . Smokeless tobacco: Never Used     Comment: 05/11/13 is cutting back on smoking  . Alcohol Use: No    Allergies:  Allergies  Allergen Reactions  . Valium [Diazepam] Other (See Comments)    Muscles "locked up"    Prescriptions prior to  admission  Medication Sig Dispense Refill  . cephALEXin (KEFLEX) 500 MG capsule Take 1 capsule (500 mg total) by mouth 4 (four) times daily.  40 capsule  0  . Prenatal Vit-Fe Fumarate-FA (PRENATAL VITAMINS PLUS PO) Take 1 tablet by mouth daily.       Results for orders placed during the hospital encounter of 06/05/13 (from the past 24 hour(s))  URINALYSIS, ROUTINE W REFLEX MICROSCOPIC     Status: Abnormal   Collection Time    06/05/13 10:05 AM      Result Value Range   Color, Urine YELLOW  YELLOW   APPearance CLEAR  CLEAR   Specific Gravity, Urine 1.020  1.005 - 1.030   pH 7.5  5.0 - 8.0   Glucose, UA NEGATIVE  NEGATIVE mg/dL   Hgb urine dipstick LARGE (*) NEGATIVE   Bilirubin Urine NEGATIVE  NEGATIVE   Ketones, ur NEGATIVE  NEGATIVE mg/dL   Protein, ur NEGATIVE  NEGATIVE mg/dL   Urobilinogen, UA 0.2  0.0 - 1.0 mg/dL   Nitrite NEGATIVE  NEGATIVE   Leukocytes, UA SMALL (*) NEGATIVE  WET PREP, GENITAL     Status: Abnormal   Collection Time    06/05/13 10:05 AM      Result Value Range   Yeast Wet Prep HPF POC  NONE SEEN  NONE SEEN   Trich, Wet Prep NONE SEEN  NONE SEEN   Clue Cells Wet Prep HPF POC FEW (*) NONE SEEN   WBC, Wet Prep HPF POC MODERATE (*) NONE SEEN  URINE MICROSCOPIC-ADD ON     Status: Abnormal   Collection Time    06/05/13 10:05 AM      Result Value Range   Squamous Epithelial / LPF FEW (*) RARE   WBC, UA 7-10  <3 WBC/hpf   RBC / HPF 21-50  <3 RBC/hpf   Bacteria, UA FEW (*) RARE    Review of Systems  Constitutional: Negative for fever and chills.  Gastrointestinal: Positive for abdominal pain. Negative for nausea and vomiting.       Generalized abdominal pain that feels like cramping   Genitourinary: Negative for dysuria, urgency and frequency.       No vaginal discharge at this time No vaginal bleeding at this time  No dysuria.    Physical Exam   Blood pressure 99/61, pulse 95, temperature 99.6 F (37.6 C), temperature source Oral, resp. rate 18,  height 5\' 4"  (1.626 m), weight 66.044 kg (145 lb 9.6 oz), last menstrual period 02/12/2013.  Fetal heart tones by doppler: 142 bpm   Physical Exam  Constitutional: She is oriented to person, place, and time. She appears well-developed and well-nourished. No distress.  HENT:  Head: Normocephalic.  Neck: Neck supple.  GI: Soft. She exhibits no distension. There is no tenderness. There is no rebound and no guarding.  Genitourinary: Vaginal discharge found.  Speculum exam: Vagina - Small amount of creamy discharge, no odor; no blood in the vaginal canal  Cervix - No contact bleeding Bimanual exam: Cervix closed Uterus non tender, normal size for gestational age  Adnexa non tender, no masses bilaterally GC/Chlam, wet prep done Chaperone present for exam.   Neurological: She is alert and oriented to person, place, and time.  Skin: Skin is warm. She is not diaphoretic.  Psychiatric: Her speech is normal. Her mood appears anxious.  Tearful     MAU Course  Procedures  Wet prep GC/chlamydia- pending Consulted with Dr. Marice Potter at 1055: agrees with plan of care.  Pt and spouse felt reassured prior to discharge.   Assessment and Plan  A: Abdominal pain in pregnancy  Bacterial vaginosis   P: Discharge home RX: Flagyl 500 mg PO BID times 7 days (#14) No RF Continue medication for UTI  Pelvic rest discussed  Keep your appointment on 06/08/2013 at the [facility] clinic Return to MAU if symptoms return or worsen Support given  [facility], JENNIFER IRENE 06/05/2013, 11:00 AM

## 2013-06-06 LAB — URINE CULTURE

## 2013-06-06 LAB — GC/CHLAMYDIA PROBE AMP: GC Probe RNA: NEGATIVE

## 2013-06-08 ENCOUNTER — Encounter: Payer: Medicaid Other | Admitting: Advanced Practice Midwife

## 2013-06-08 LAB — POCT URINALYSIS DIP (DEVICE)
Glucose, UA: NEGATIVE mg/dL
Hgb urine dipstick: NEGATIVE
Specific Gravity, Urine: 1.02 (ref 1.005–1.030)
Urobilinogen, UA: 0.2 mg/dL (ref 0.0–1.0)
pH: 7 (ref 5.0–8.0)

## 2013-06-22 ENCOUNTER — Ambulatory Visit (INDEPENDENT_AMBULATORY_CARE_PROVIDER_SITE_OTHER): Payer: Medicaid Other | Admitting: Family Medicine

## 2013-06-22 ENCOUNTER — Encounter: Payer: Self-pay | Admitting: Family Medicine

## 2013-06-22 VITALS — BP 108/66 | Temp 96.7°F | Wt 149.4 lb

## 2013-06-22 DIAGNOSIS — O09299 Supervision of pregnancy with other poor reproductive or obstetric history, unspecified trimester: Secondary | ICD-10-CM

## 2013-06-22 LAB — POCT URINALYSIS DIP (DEVICE)
Bilirubin Urine: NEGATIVE
Hgb urine dipstick: NEGATIVE
Ketones, ur: NEGATIVE mg/dL
Nitrite: NEGATIVE
Protein, ur: NEGATIVE mg/dL
pH: 7 (ref 5.0–8.0)

## 2013-06-22 NOTE — Progress Notes (Signed)
P:=77,C/o cold in her chest- congestion, cough,

## 2013-06-22 NOTE — Progress Notes (Signed)
22 yo G5P0220 @ [redacted]w[redacted]d here for robv  Having some nausea and vomiting still but rare Cold symptoms- wondering what to take No ctx, lof, vb. +FM  O: see flowsheet   A/p - cold symptoms: ok to take robitussin and sudafed if needed - discussed echogenic cardiac focus on [place]. Normal quad. No other abnormalities. Reassurance at this time - PTL precautions discussed - f/u in 4 weeks.

## 2013-07-01 ENCOUNTER — Telehealth: Payer: Self-pay | Admitting: *Deleted

## 2013-07-01 ENCOUNTER — Inpatient Hospital Stay (HOSPITAL_COMMUNITY)
Admission: AD | Admit: 2013-07-01 | Discharge: 2013-07-01 | Disposition: A | Discharge status: Home / Self Care | Payer: Medicaid Other | Source: Ambulatory Visit | Attending: Obstetrics & Gynecology | Admitting: Obstetrics & Gynecology

## 2013-07-01 ENCOUNTER — Encounter (HOSPITAL_COMMUNITY): Payer: Self-pay | Admitting: General Practice

## 2013-07-01 DIAGNOSIS — R109 Unspecified abdominal pain: Secondary | ICD-10-CM | POA: Insufficient documentation

## 2013-07-01 DIAGNOSIS — O99891 Other specified diseases and conditions complicating pregnancy: Secondary | ICD-10-CM | POA: Insufficient documentation

## 2013-07-01 DIAGNOSIS — E860 Dehydration: Secondary | ICD-10-CM

## 2013-07-01 LAB — URINALYSIS, ROUTINE W REFLEX MICROSCOPIC
Bilirubin Urine: NEGATIVE
Ketones, ur: NEGATIVE mg/dL
Nitrite: NEGATIVE
Protein, ur: NEGATIVE mg/dL
Urobilinogen, UA: 0.2 mg/dL (ref 0.0–1.0)

## 2013-07-01 NOTE — MAU Note (Signed)
Patient states she has been having lower abdominal cramping and back pain for a couple of days, has had vomiting and had diarrhea yesterday. Denies bleeding or discharge.

## 2013-07-01 NOTE — Telephone Encounter (Signed)
Shawna Michael left a message stating she is having period like cramps for 3 days that are getting very uncomfortable- should I come In ?  Called Shawna Michael and advised her to come in for evalluation since we are closed for the weekend.  She states she will come in for evalluation.

## 2013-07-01 NOTE — MAU Provider Note (Addendum)
History     CSN: [id number]  Arrival date and time: 07/01/13 1518   First Provider Initiated Contact with Patient 07/01/13 1627      Chief Complaint  Patient presents with  . Abdominal Pain  . Emesis During Pregnancy   HPI Comments: Shawna Michael is a 22 y.o. [id number] at [redacted]w[redacted]d who receives prenatal care at [facility] clinic presenting with cramping for two days. She describes it as intermittent occuring every 5-10 minutes and feeling like a period. She feels it in her lower abdomen and radiates to her back. She has not felt contractions and reports no LOF, no vaginal bleeding, no vaginal discharge and good fetal movement. She has had nausea and vomiting throughout her pregnancy that occurs in the morning. She has had watery stools yesterday.   OB History   Grav Para Term Preterm Abortions TAB SAB Ect Mult Living   5 2  2 2  2   1       Past Medical History  Diagnosis Date  . Complication of anesthesia   . Spinal headache     Past Surgical History  Procedure Laterality Date  . Mandible fracture surgery  2003    Family History  Problem Relation Age of Onset  . Adopted: Yes    History  Substance Use Topics  . Smoking status: Current Every Day Smoker -- 0.25 packs/day  . Smokeless tobacco: Never Used     Comment: 05/11/13 is cutting back on smoking  . Alcohol Use: No    Allergies:  Allergies  Allergen Reactions  . Valium [Diazepam] Other (See Comments)    Muscles "locked up"    Prescriptions prior to admission  Medication Sig Dispense Refill  . acetaminophen (TYLENOL) 500 MG tablet Take 1,000 mg by mouth every 6 (six) hours as needed for pain.      . Prenatal Vit-Fe Fumarate-FA (PRENATAL MULTIVITAMIN) TABS tablet Take 1 tablet by mouth daily at 12 noon.        Review of Systems  Constitutional: Negative for fever.  Gastrointestinal: Positive for nausea, vomiting and abdominal pain.  Genitourinary: Negative for dysuria.   Physical Exam   Blood pressure  103/53, pulse 74, temperature 98.2 F (36.8 C), temperature source Oral, resp. rate 16, height 5' 4.5" (1.638 m), last menstrual period 02/12/2013, SpO2 99.00%.  Physical Exam  Constitutional: She is oriented to person, place, and time. She appears well-developed and well-nourished. No distress.  Cardiovascular: Normal rate, regular rhythm and normal heart sounds.   Respiratory: Effort normal and breath sounds normal. No respiratory distress. She has no wheezes. She has no rales.  GI: Soft. Bowel sounds are normal. There is no tenderness. There is no rebound and no guarding.  Musculoskeletal: Normal range of motion. She exhibits no edema and no tenderness.  Neurological: She is alert and oriented to person, place, and time.  Skin: Skin is warm and dry. She is not diaphoretic. No erythema.   FHT: 140bpm, moderate variability, +10/10 accel, no decels, Category I tracing UC: None  MAU Course  Procedures  MDM - patient given water and crackers. Her cramps subsided before examination. Urinalysis showed a specific gravity of 1.025, which suggest mild dehydration.  Assessment and Plan  Shawna Michael is a 22 y.o. [id number] at [redacted]w[redacted]d who presents with mild dehydration  #Mild dehydration - counseled patient to drink more water. She voiced understanding and said she would increase her intake - labor precautions - encouraged patient to keep upcoming prenatal visit -  counseled to return if symptoms return or worsen  Jacquelin Hawking 07/01/2013, 4:32 PM   I spoke with and examined patient and agree with resident's note and plan of care.  Tawana Scale, MD OB Fellow 07/01/2013 8:46 PM

## 2013-07-02 LAB — URINE CULTURE: Colony Count: 6000

## 2013-07-14 NOTE — MAU Provider Note (Signed)
Attestation of Attending Supervision of Fellow: Evaluation and management procedures were performed by the Fellow under my supervision and collaboration.  I have reviewed the Fellow's note and chart, and I agree with the management and plan.    

## 2013-07-20 ENCOUNTER — Ambulatory Visit (INDEPENDENT_AMBULATORY_CARE_PROVIDER_SITE_OTHER): Payer: Medicaid Other | Admitting: Family Medicine

## 2013-07-20 VITALS — BP 100/68 | Wt 154.3 lb

## 2013-07-20 DIAGNOSIS — Z3492 Encounter for supervision of normal pregnancy, unspecified, second trimester: Secondary | ICD-10-CM

## 2013-07-20 DIAGNOSIS — O09299 Supervision of pregnancy with other poor reproductive or obstetric history, unspecified trimester: Secondary | ICD-10-CM

## 2013-07-20 LAB — POCT URINALYSIS DIP (DEVICE)
Protein, ur: NEGATIVE mg/dL
Urobilinogen, UA: 0.2 mg/dL (ref 0.0–1.0)
pH: 8.5 — ABNORMAL HIGH (ref 5.0–8.0)

## 2013-07-20 NOTE — Progress Notes (Signed)
Pulse: 91 Pt fell in shower 3-4 days ago. Had some spotting the next day. Baby is moving well. No bleeding since that time. Did not come to MAU to be evaluated.  Would like to do 28 week labs at the next visit. Wasn't prepared to stay today.

## 2013-07-20 NOTE — Patient Instructions (Signed)
Pregnancy - Second Trimester The second trimester of pregnancy (3 to 6 months) is a period of rapid growth for you and your baby. At the end of the sixth month, your baby is about 9 inches long and weighs 1 1/2 pounds. You will begin to feel the baby move between 18 and 20 weeks of the pregnancy. This is called quickening. Weight gain is faster. A clear fluid (colostrum) may leak out of your breasts. You may feel small contractions of the womb (uterus). This is known as false labor or Braxton-Hicks contractions. This is like a practice for labor when the baby is ready to be born. Usually, the problems with morning sickness have usually passed by the end of your first trimester. Some women develop small dark blotches (called cholasma, mask of pregnancy) on their face that usually goes away after the baby is born. Exposure to the sun makes the blotches worse. Acne may also develop in some pregnant women and pregnant women who have acne, may find that it goes away. PRENATAL EXAMS  Blood work may continue to be done during prenatal exams. These tests are done to check on your health and the probable health of your baby. Blood work is used to follow your blood levels (hemoglobin). Anemia (low hemoglobin) is common during pregnancy. Iron and vitamins are given to help prevent this. You will also be checked for diabetes between 24 and 28 weeks of the pregnancy. Some of the previous blood tests may be repeated.  The size of the uterus is measured during each visit. This is to make sure that the baby is continuing to grow properly according to the dates of the pregnancy.  Your blood pressure is checked every prenatal visit. This is to make sure you are not getting toxemia.  Your urine is checked to make sure you do not have an infection, diabetes or protein in the urine.  Your weight is checked often to make sure gains are happening at the suggested rate. This is to ensure that both you and your baby are  growing normally.  Sometimes, an ultrasound is performed to confirm the proper growth and development of the baby. This is a test which bounces harmless sound waves off the baby so your caregiver can more accurately determine due dates. Sometimes, a test is done on the amniotic fluid surrounding the baby. This test is called an amniocentesis. The amniotic fluid is obtained by sticking a needle into the belly (abdomen). This is done to check the chromosomes in instances where there is a concern about possible genetic problems with the baby. It is also sometimes done near the end of pregnancy if an early delivery is required. In this case, it is done to help make sure the baby's lungs are mature enough for the baby to live outside of the womb. CHANGES OCCURING IN THE SECOND TRIMESTER OF PREGNANCY Your body goes through many changes during pregnancy. They vary from person to person. Talk to your caregiver about changes you notice that you are concerned about.  During the second trimester, you will likely have an increase in your appetite. It is normal to have cravings for certain foods. This varies from person to person and pregnancy to pregnancy.  Your lower abdomen will begin to bulge.  You may have to urinate more often because the uterus and baby are pressing on your bladder. It is also common to get more bladder infections during pregnancy. You can help this by drinking lots of fluids   and emptying your bladder before and after intercourse.  You may begin to get stretch marks on your hips, abdomen, and breasts. These are normal changes in the body during pregnancy. There are no exercises or medicines to take that prevent this change.  You may begin to develop swollen and bulging veins (varicose veins) in your legs. Wearing support hose, elevating your feet for 15 minutes, 3 to 4 times a day and limiting salt in your diet helps lessen the problem.  Heartburn may develop as the uterus grows and  pushes up against the stomach. Antacids recommended by your caregiver helps with this problem. Also, eating smaller meals 4 to 5 times a day helps.  Constipation can be treated with a stool softener or adding bulk to your diet. Drinking lots of fluids, and eating vegetables, fruits, and whole grains are helpful.  Exercising is also helpful. If you have been very active up until your pregnancy, most of these activities can be continued during your pregnancy. If you have been less active, it is helpful to start an exercise program such as walking.  Hemorrhoids may develop at the end of the second trimester. Warm sitz baths and hemorrhoid cream recommended by your caregiver helps hemorrhoid problems.  Backaches may develop during this time of your pregnancy. Avoid heavy lifting, wear low heal shoes, and practice good posture to help with backache problems.  Some pregnant women develop tingling and numbness of their hand and fingers because of swelling and tightening of ligaments in the wrist (carpel tunnel syndrome). This goes away after the baby is born.  As your breasts enlarge, you may have to get a bigger bra. Get a comfortable, cotton, support bra. Do not get a nursing bra until the last month of the pregnancy if you will be nursing the baby.  You may get a dark line from your belly button to the pubic area called the linea nigra.  You may develop rosy cheeks because of increase blood flow to the face.  You may develop spider looking lines of the face, neck, arms, and chest. These go away after the baby is born. HOME CARE INSTRUCTIONS   It is extremely important to avoid all smoking, herbs, alcohol, and unprescribed drugs during your pregnancy. These chemicals affect the formation and growth of the baby. Avoid these chemicals throughout the pregnancy to ensure the delivery of a healthy infant.  Most of your home care instructions are the same as suggested for the first trimester of your  pregnancy. Keep your caregiver's appointments. Follow your caregiver's instructions regarding medicine use, exercise, and diet.  During pregnancy, you are providing food for you and your baby. Continue to eat regular, well-balanced meals. Choose foods such as meat, fish, milk and other low fat dairy products, vegetables, fruits, and whole-grain breads and cereals. Your caregiver will tell you of the ideal weight gain.  A physical sexual relationship may be continued up until near the end of pregnancy if there are no other problems. Problems could include early (premature) leaking of amniotic fluid from the membranes, vaginal bleeding, abdominal pain, or other medical or pregnancy problems.  Exercise regularly if there are no restrictions. Check with your caregiver if you are unsure of the safety of some of your exercises. The greatest weight gain will occur in the last 2 trimesters of pregnancy. Exercise will help you:  Control your weight.  Get you in shape for labor and delivery.  Lose weight after you have the baby.  Wear   a good support or jogging bra for breast tenderness during pregnancy. This may help if worn during sleep. Pads or tissues may be used in the bra if you are leaking colostrum.  Do not use hot tubs, steam rooms or saunas throughout the pregnancy.  Wear your seat belt at all times when driving. This protects you and your baby if you are in an accident.  Avoid raw meat, uncooked cheese, cat litter boxes, and soil used by cats. These carry germs that can cause birth defects in the baby.  The second trimester is also a good time to visit your dentist for your dental health if this has not been done yet. Getting your teeth cleaned is okay. Use a soft toothbrush. Brush gently during pregnancy.  It is easier to leak urine during pregnancy. Tightening up and strengthening the pelvic muscles will help with this problem. Practice stopping your urination while you are going to the  bathroom. These are the same muscles you need to strengthen. It is also the muscles you would use as if you were trying to stop from passing gas. You can practice tightening these muscles up 10 times a set and repeating this about 3 times per day. Once you know what muscles to tighten up, do not perform these exercises during urination. It is more likely to contribute to an infection by backing up the urine.  Ask for help if you have financial, counseling, or nutritional needs during pregnancy. Your caregiver will be able to offer counseling for these needs as well as refer you for other special needs.  Your skin may become oily. If so, wash your face with mild soap, use non-greasy moisturizer and oil or cream based makeup. MEDICINES AND DRUG USE IN PREGNANCY  Take prenatal vitamins as directed. The vitamin should contain 1 milligram of folic acid. Keep all vitamins out of reach of children. Only a couple vitamins or tablets containing iron may be fatal to a baby or young child when ingested.  Avoid use of all medicines, including herbs, over-the-counter medicines, not prescribed or suggested by your caregiver. Only take over-the-counter or prescription medicines for pain, discomfort, or fever as directed by your caregiver. Do not use aspirin.  Let your caregiver also know about herbs you may be using.  Alcohol is related to a number of birth defects. This includes fetal alcohol syndrome. All alcohol, in any form, should be avoided completely. Smoking will cause low birth rate and premature babies.  Street or illegal drugs are very harmful to the baby. They are absolutely forbidden. A baby born to an addicted mother will be addicted at birth. The baby will go through the same withdrawal an adult does. SEEK MEDICAL CARE IF:  You have any concerns or worries during your pregnancy. It is better to call with your questions if you feel they cannot wait, rather than worry about them. SEEK IMMEDIATE  MEDICAL CARE IF:   An unexplained oral temperature above 102 F (38.9 C) develops, or as your caregiver suggests.  You have leaking of fluid from the vagina (birth canal). If leaking membranes are suspected, take your temperature and tell your caregiver of this when you call.  There is vaginal spotting, bleeding, or passing clots. Tell your caregiver of the amount and how many pads are used. Light spotting in pregnancy is common, especially following intercourse.  You develop a bad smelling vaginal discharge with a change in the color from clear to white.  You continue to feel   sick to your stomach (nauseated) and have no relief from remedies suggested. You vomit blood or coffee ground-like materials.  You lose more than 2 pounds of weight or gain more than 2 pounds of weight over 1 week, or as suggested by your caregiver.  You notice swelling of your face, hands, feet, or legs.  You get exposed to German measles and have never had them.  You are exposed to fifth disease or chickenpox.  You develop belly (abdominal) pain. Round ligament discomfort is a common non-cancerous (benign) cause of abdominal pain in pregnancy. Your caregiver still must evaluate you.  You develop a bad headache that does not go away.  You develop fever, diarrhea, pain with urination, or shortness of breath.  You develop visual problems, blurry, or double vision.  You fall or are in a car accident or any kind of trauma.  There is mental or physical violence at home. Document Released: 09/16/2001 Document Revised: 06/16/2012 Document Reviewed: 03/21/2009 [employer] Patient Information 2014 [employer] Place 24-38 weeks prenatal visit patient instructions here.  

## 2013-07-20 NOTE — Progress Notes (Signed)
  Subjective:    Shawna Michael is a 22 y.o. female being seen today for her obstetrical visit. She is at [redacted]w[redacted]d gestation. Patient reports no bleeding, no contractions, no cramping and no leaking. Fetal movement: normal.  Pt fell 4 days ago in shower had a few contractions, small dark brown bleeding for a couple of hours on following day. Has completely resolved. With good fetal movement. Pt reports safe at home.   Menstrual History: OB History   Grav Para Term Preterm Abortions TAB SAB Ect Mult Living   5 2  2 2  2   1        Patient's last menstrual period was 02/12/2013.    The following portions of the patient's history were reviewed and updated as appropriate: allergies, current medications, past family history, past medical history, past social history, past surgical history and problem list.  Review of Systems Pertinent items are noted in HPI.   Objective:    BP 100/68  Wt 69.99 kg (154 lb 4.8 oz)  BMI 26.09 kg/m2  LMP 02/12/2013 FHT: 140s BPM  Uterine Size: 26 cm and size equals dates     Assessment:   Shawna Michael is a 22 y.o. [id number] at [redacted]w[redacted]d  here for ROB visit.  Discussed with Patient:  -Plans to breast feed.  All questions answered. -Continue prenatal vitamins. - Reviewed genetics screen (Quad screen / first trimester screen / serum integrated screen / full integrated screen done/ not done).   -Reviewed fetal kick counts (Pt to perform daily at a time when the baby is active, lie laterally with both hands on belly in quiet room and count all movements (hiccups, shoulder rolls, obvious kicks, etc); pt is to report to clinic or MAU for less than 10 movements felt in a one hour time period-pt told as soon as she counts 10 movements the count is complete.)  - Routine precautions discussed (depression, infection s/s).   Patient provided with all pertinent phone numbers for emergencies. - RTC for any VB, regular, painful cramps/ctxs occurring at a rate of >2/10 min,  fever (100.5 or higher), n/v/d, any pain that is unresolving or worsening, LOF, decreased fetal movement, CP, SOB, edema  Problems: Patient Active Problem List   Diagnosis Date Noted  . 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 Pregnancy with other poor reproductive history 05/13/2013    To Do: 1.   [ ]  Vaccines: [id number]  Tdap:  [ ]  BCM: paragaurd  Edu: [ x] PTL precautions; [ ]  BF class; [ ]  childbirth class; [ ]   BF counseling;

## 2013-08-03 ENCOUNTER — Ambulatory Visit (INDEPENDENT_AMBULATORY_CARE_PROVIDER_SITE_OTHER): Payer: Medicaid Other | Admitting: Obstetrics and Gynecology

## 2013-08-03 VITALS — BP 105/71 | Temp 97.7°F | Wt 157.4 lb

## 2013-08-03 DIAGNOSIS — O09299 Supervision of pregnancy with other poor reproductive or obstetric history, unspecified trimester: Secondary | ICD-10-CM

## 2013-08-03 DIAGNOSIS — O2693 Pregnancy related conditions, unspecified, third trimester: Secondary | ICD-10-CM

## 2013-08-03 DIAGNOSIS — O2690 Pregnancy related conditions, unspecified, unspecified trimester: Secondary | ICD-10-CM

## 2013-08-03 LAB — CBC
Hemoglobin: 10.7 g/dL — ABNORMAL LOW (ref 12.0–15.0)
MCH: 30.5 pg (ref 26.0–34.0)
RBC: 3.51 MIL/uL — ABNORMAL LOW (ref 3.87–5.11)
RDW: 13.4 % (ref 11.5–15.5)

## 2013-08-03 LAB — POCT URINALYSIS DIP (DEVICE)
Glucose, UA: NEGATIVE mg/dL
Nitrite: NEGATIVE
Protein, ur: NEGATIVE mg/dL
Urobilinogen, UA: 0.2 mg/dL (ref 0.0–1.0)

## 2013-08-03 NOTE — Progress Notes (Signed)
Left calf sore x 2 wks, and gets cramps only on left at night. Denies redness. No respiratory sx. Discussed cramps and to go to MAU if worse or an SOB. No bleeding. Good FM.  1 hr glucola today.

## 2013-08-03 NOTE — Progress Notes (Signed)
Pulse-  95 

## 2013-08-03 NOTE — Patient Instructions (Signed)

## 2013-08-04 LAB — GLUCOSE TOLERANCE, 1 HOUR (50G) W/O FASTING: Glucose, 1 Hour GTT: 73 mg/dL (ref 70–140)

## 2013-08-04 LAB — RPR

## 2013-08-04 LAB — HIV ANTIBODY (ROUTINE TESTING W REFLEX): HIV: NONREACTIVE

## 2013-08-10 ENCOUNTER — Encounter: Payer: Self-pay | Admitting: *Deleted

## 2013-08-17 ENCOUNTER — Encounter: Payer: Self-pay | Admitting: Obstetrics and Gynecology

## 2013-08-17 ENCOUNTER — Ambulatory Visit (INDEPENDENT_AMBULATORY_CARE_PROVIDER_SITE_OTHER): Payer: Medicaid Other | Admitting: Obstetrics and Gynecology

## 2013-08-17 VITALS — BP 108/64 | Temp 97.9°F | Wt 158.4 lb

## 2013-08-17 DIAGNOSIS — Z0374 Encounter for suspected problem with fetal growth ruled out: Secondary | ICD-10-CM

## 2013-08-17 DIAGNOSIS — Z3483 Encounter for supervision of other normal pregnancy, third trimester: Secondary | ICD-10-CM

## 2013-08-17 DIAGNOSIS — J069 Acute upper respiratory infection, unspecified: Secondary | ICD-10-CM

## 2013-08-17 DIAGNOSIS — O09299 Supervision of pregnancy with other poor reproductive or obstetric history, unspecified trimester: Secondary | ICD-10-CM

## 2013-08-17 LAB — POCT URINALYSIS DIP (DEVICE)
Bilirubin Urine: NEGATIVE
Hgb urine dipstick: NEGATIVE
Ketones, ur: 15 mg/dL — AB
Nitrite: NEGATIVE
Protein, ur: 30 mg/dL — AB
Urobilinogen, UA: 0.2 mg/dL (ref 0.0–1.0)

## 2013-08-17 MED ORDER — PSEUDOEPHEDRINE HCL 30 MG PO TABS: 30.0000 mg | ORAL_TABLET | ORAL | Status: DC | PRN

## 2013-08-17 NOTE — Progress Notes (Signed)
No UTI sx. Tr LE and pro>send C&S

## 2013-08-17 NOTE — Progress Notes (Signed)
P=   Pt reports fever of 104 at 0500, took Tylenol 1000mg  po.  C/o cold symptoms and vomiting since yesterday.  Son was sick earlier this week

## 2013-08-17 NOTE — Progress Notes (Signed)
Very worried about past SABs and 26 wk anomalous fetus.  URI, stuffy, ears popping. TMs grey with good light reflex. Throat sl. Injected. ? Fevers. Lungs clear.  Has stopped smoking x 1 wk. Encouraged.  S<D: [place] interval growth

## 2013-08-17 NOTE — Patient Instructions (Signed)

## 2013-08-25 ENCOUNTER — Ambulatory Visit (HOSPITAL_COMMUNITY)
Admission: RE | Admit: 2013-08-25 | Discharge: 2013-08-25 | Disposition: A | Discharge status: Home / Self Care | Payer: Medicaid Other | Source: Ambulatory Visit | Attending: Obstetrics and Gynecology | Admitting: Obstetrics and Gynecology

## 2013-08-25 ENCOUNTER — Ambulatory Visit (HOSPITAL_COMMUNITY): Admission: RE | Admit: 2013-08-25 | Payer: Medicaid Other | Source: Ambulatory Visit

## 2013-08-25 DIAGNOSIS — O365990 Maternal care for other known or suspected poor fetal growth, unspecified trimester, not applicable or unspecified: Secondary | ICD-10-CM | POA: Insufficient documentation

## 2013-08-25 DIAGNOSIS — Z3483 Encounter for supervision of other normal pregnancy, third trimester: Secondary | ICD-10-CM

## 2013-08-25 DIAGNOSIS — Z3689 Encounter for other specified antenatal screening: Secondary | ICD-10-CM | POA: Insufficient documentation

## 2013-08-25 DIAGNOSIS — O09299 Supervision of pregnancy with other poor reproductive or obstetric history, unspecified trimester: Secondary | ICD-10-CM

## 2013-09-07 ENCOUNTER — Ambulatory Visit (INDEPENDENT_AMBULATORY_CARE_PROVIDER_SITE_OTHER): Payer: Medicaid Other | Admitting: Family Medicine

## 2013-09-07 VITALS — BP 113/77 | Temp 97.4°F | Wt 160.7 lb

## 2013-09-07 DIAGNOSIS — O09299 Supervision of pregnancy with other poor reproductive or obstetric history, unspecified trimester: Secondary | ICD-10-CM

## 2013-09-07 DIAGNOSIS — B373 Candidiasis of vulva and vagina: Secondary | ICD-10-CM

## 2013-09-07 DIAGNOSIS — O09292 Supervision of pregnancy with other poor reproductive or obstetric history, second trimester: Secondary | ICD-10-CM

## 2013-09-07 LAB — POCT URINALYSIS DIP (DEVICE)
Hgb urine dipstick: NEGATIVE
Ketones, ur: NEGATIVE mg/dL
Protein, ur: 30 mg/dL — AB
Urobilinogen, UA: 0.2 mg/dL (ref 0.0–1.0)
pH: 7.5 (ref 5.0–8.0)

## 2013-09-07 MED ORDER — FLUCONAZOLE 150 MG PO TABS: 150.0000 mg | ORAL_TABLET | Freq: Once | ORAL | Status: DC

## 2013-09-07 NOTE — Progress Notes (Signed)
P= . C/o cramps that are "pretty intense at time"  States has occurred 4 out of 7 days, States yesterday leaked a clear watery like fluid when stood up - states was leaking all day but had gush once. Declines tdap today, states may get next week.

## 2013-09-07 NOTE — Patient Instructions (Signed)
Third Trimester of Pregnancy  The third trimester is from week 29 through week 42, months 7 through 9. The third trimester is a time when the fetus is growing rapidly. At the end of the ninth month, the fetus is about 20 inches in length and weighs 6 10 pounds.   BODY CHANGES  Your body goes through many changes during pregnancy. The changes vary from woman to woman.    Your weight will continue to increase. You can expect to gain 25 35 pounds (11 16 kg) by the end of the pregnancy.   You may begin to get stretch marks on your hips, abdomen, and breasts.   You may urinate more often because the fetus is moving lower into your pelvis and pressing on your bladder.   You may develop or continue to have heartburn as a result of your pregnancy.   You may develop constipation because certain hormones are causing the muscles that push waste through your intestines to slow down.   You may develop hemorrhoids or swollen, bulging veins (varicose veins).   You may have pelvic pain because of the weight gain and pregnancy hormones relaxing your joints between the bones in your pelvis. Back aches may result from over exertion of the muscles supporting your posture.   Your breasts will continue to grow and be tender. A yellow discharge may leak from your breasts called colostrum.   Your belly button may stick out.   You may feel short of breath because of your expanding uterus.   You may notice the fetus "dropping," or moving lower in your abdomen.   You may have a bloody mucus discharge. This usually occurs a few days to a week before labor begins.   Your cervix becomes thin and soft (effaced) near your due date.  WHAT TO EXPECT AT YOUR PRENATAL EXAMS   You will have prenatal exams every 2 weeks until week 36. Then, you will have weekly prenatal exams. During a routine prenatal visit:   You will be weighed to make sure you and the fetus are growing normally.   Your blood pressure is taken.   Your abdomen will be  measured to track your baby's growth.   The fetal heartbeat will be listened to.   Any test results from the previous visit will be discussed.   You may have a cervical check near your due date to see if you have effaced.  At around 36 weeks, your caregiver will check your cervix. At the same time, your caregiver will also perform a test on the secretions of the vaginal tissue. This test is to determine if a type of bacteria, Group B streptococcus, is present. Your caregiver will explain this further.  Your caregiver may ask you:   What your birth plan is.   How you are feeling.   If you are feeling the baby move.   If you have had any abnormal symptoms, such as leaking fluid, bleeding, severe headaches, or abdominal cramping.   If you have any questions.  Other tests or screenings that may be performed during your third trimester include:   Blood tests that check for low iron levels (anemia).   Fetal testing to check the health, activity level, and growth of the fetus. Testing is done if you have certain medical conditions or if there are problems during the pregnancy.  FALSE LABOR  You may feel small, irregular contractions that eventually go away. These are called Braxton Hicks contractions, or   false labor. Contractions may last for hours, days, or even weeks before true labor sets in. If contractions come at regular intervals, intensify, or become painful, it is best to be seen by your caregiver.   SIGNS OF LABOR    Menstrual-like cramps.   Contractions that are 5 minutes apart or less.   Contractions that start on the top of the uterus and spread down to the lower abdomen and back.   A sense of increased pelvic pressure or back pain.   A watery or bloody mucus discharge that comes from the vagina.  If you have any of these signs before the 37th week of pregnancy, call your caregiver right away. You need to go to the hospital to get checked immediately.  HOME CARE INSTRUCTIONS    Avoid all  smoking, herbs, alcohol, and unprescribed drugs. These chemicals affect the formation and growth of the baby.   Follow your caregiver's instructions regarding medicine use. There are medicines that are either safe or unsafe to take during pregnancy.   Exercise only as directed by your caregiver. Experiencing uterine cramps is a good sign to stop exercising.   Continue to eat regular, healthy meals.   Wear a good support bra for breast tenderness.   Do not use hot tubs, steam rooms, or saunas.   Wear your seat belt at all times when driving.   Avoid raw meat, uncooked cheese, cat litter boxes, and soil used by cats. These carry germs that can cause birth defects in the baby.   Take your prenatal vitamins.   Try taking a stool softener (if your caregiver approves) if you develop constipation. Eat more high-fiber foods, such as fresh vegetables or fruit and whole grains. Drink plenty of fluids to keep your urine clear or pale yellow.   Take warm sitz baths to soothe any pain or discomfort caused by hemorrhoids. Use hemorrhoid cream if your caregiver approves.   If you develop varicose veins, wear support hose. Elevate your feet for 15 minutes, 3 4 times a day. Limit salt in your diet.   Avoid heavy lifting, wear low heal shoes, and practice good posture.   Rest a lot with your legs elevated if you have leg cramps or low back pain.   Visit your dentist if you have not gone during your pregnancy. Use a soft toothbrush to brush your teeth and be gentle when you floss.   A sexual relationship may be continued unless your caregiver directs you otherwise.   Do not travel far distances unless it is absolutely necessary and only with the approval of your caregiver.   Take prenatal classes to understand, practice, and ask questions about the labor and delivery.   Make a trial run to the hospital.   Pack your hospital bag.   Prepare the baby's nursery.   Continue to go to all your prenatal visits as directed  by your caregiver.  SEEK MEDICAL CARE IF:   You are unsure if you are in labor or if your water has broken.   You have dizziness.   You have mild pelvic cramps, pelvic pressure, or nagging pain in your abdominal area.   You have persistent nausea, vomiting, or diarrhea.   You have a bad smelling vaginal discharge.   You have pain with urination.  SEEK IMMEDIATE MEDICAL CARE IF:    You have a fever.   You are leaking fluid from your vagina.   You have spotting or bleeding from your vagina.     You have severe abdominal cramping or pain.   You have rapid weight loss or gain.   You have shortness of breath with chest pain.   You notice sudden or extreme swelling of your face, hands, ankles, feet, or legs.   You have not felt your baby move in over an hour.   You have severe headaches that do not go away with medicine.   You have vision changes.  Document Released: 09/16/2001 Document Revised: 05/25/2013 Document Reviewed: 11/23/2012  [employer] Patient Information 2014 [employer]

## 2013-09-07 NOTE — Progress Notes (Signed)
Pt is a 22 yo [id number] here for ROBV.   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  - yesterday had a watery discharge but now gone.  Yesterday pm felt a gush of watery fluid and then continued to leak for a couple hours. Soaked through her underwear and into her pants.  - +FM. No VB  O: see flowsheet  SSE: significant vaginal irritation and thick white curdish discharge.    A/P ?LOF - appears to be yeast on exam.  - neg pool, neg fern, neg nitrazine - send wet prep and gc/chl - will treat empirically with diflucan.   PTL precautions discussed F/u in 2-3 weeks for 36 week visit.

## 2013-09-08 LAB — OB RESULTS CONSOLE GC/CHLAMYDIA
Chlamydia: NEGATIVE
Gonorrhea: NEGATIVE

## 2013-09-08 LAB — GC/CHLAMYDIA PROBE AMP
CT Probe RNA: NEGATIVE
GC Probe RNA: NEGATIVE

## 2013-09-08 LAB — WET PREP, GENITAL: WBC, Wet Prep HPF POC: NONE SEEN

## 2013-09-21 ENCOUNTER — Ambulatory Visit (INDEPENDENT_AMBULATORY_CARE_PROVIDER_SITE_OTHER): Payer: Medicaid Other | Admitting: Obstetrics and Gynecology

## 2013-09-21 VITALS — BP 122/78 | Temp 97.0°F | Wt 159.2 lb

## 2013-09-21 DIAGNOSIS — O09299 Supervision of pregnancy with other poor reproductive or obstetric history, unspecified trimester: Secondary | ICD-10-CM

## 2013-09-21 DIAGNOSIS — O09293 Supervision of pregnancy with other poor reproductive or obstetric history, third trimester: Secondary | ICD-10-CM

## 2013-09-21 LAB — POCT URINALYSIS DIP (DEVICE)
Bilirubin Urine: NEGATIVE
Glucose, UA: NEGATIVE mg/dL
Hgb urine dipstick: NEGATIVE
Ketones, ur: 15 mg/dL — AB
Nitrite: NEGATIVE
Urobilinogen, UA: 0.2 mg/dL (ref 0.0–1.0)

## 2013-09-21 NOTE — Progress Notes (Signed)
Doing well. RLP discussed. [place] last month AGA, nl AFI. GC/CT neg. NO LOF or VB. 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 Will refer to Derm PP.

## 2013-09-21 NOTE — Progress Notes (Signed)
Pulse- 111 Patient reports pelvic pressure 

## 2013-09-21 NOTE — Patient Instructions (Signed)
Third Trimester of Pregnancy  The third trimester is from week 29 through week 42, months 7 through 9. The third trimester is a time when the fetus is growing rapidly. At the end of the ninth month, the fetus is about 20 inches in length and weighs 6 10 pounds.   BODY CHANGES  Your body goes through many changes during pregnancy. The changes vary from woman to woman.    Your weight will continue to increase. You can expect to gain 25 35 pounds (11 16 kg) by the end of the pregnancy.   You may begin to get stretch marks on your hips, abdomen, and breasts.   You may urinate more often because the fetus is moving lower into your pelvis and pressing on your bladder.   You may develop or continue to have heartburn as a result of your pregnancy.   You may develop constipation because certain hormones are causing the muscles that push waste through your intestines to slow down.   You may develop hemorrhoids or swollen, bulging veins (varicose veins).   You may have pelvic pain because of the weight gain and pregnancy hormones relaxing your joints between the bones in your pelvis. Back aches may result from over exertion of the muscles supporting your posture.   Your breasts will continue to grow and be tender. A yellow discharge may leak from your breasts called colostrum.   Your belly button may stick out.   You may feel short of breath because of your expanding uterus.   You may notice the fetus "dropping," or moving lower in your abdomen.   You may have a bloody mucus discharge. This usually occurs a few days to a week before labor begins.   Your cervix becomes thin and soft (effaced) near your due date.  WHAT TO EXPECT AT YOUR PRENATAL EXAMS   You will have prenatal exams every 2 weeks until week 36. Then, you will have weekly prenatal exams. During a routine prenatal visit:   You will be weighed to make sure you and the fetus are growing normally.   Your blood pressure is taken.   Your abdomen will be  measured to track your baby's growth.   The fetal heartbeat will be listened to.   Any test results from the previous visit will be discussed.   You may have a cervical check near your due date to see if you have effaced.  At around 36 weeks, your caregiver will check your cervix. At the same time, your caregiver will also perform a test on the secretions of the vaginal tissue. This test is to determine if a type of bacteria, Group B streptococcus, is present. Your caregiver will explain this further.  Your caregiver may ask you:   What your birth plan is.   How you are feeling.   If you are feeling the baby move.   If you have had any abnormal symptoms, such as leaking fluid, bleeding, severe headaches, or abdominal cramping.   If you have any questions.  Other tests or screenings that may be performed during your third trimester include:   Blood tests that check for low iron levels (anemia).   Fetal testing to check the health, activity level, and growth of the fetus. Testing is done if you have certain medical conditions or if there are problems during the pregnancy.  FALSE LABOR  You may feel small, irregular contractions that eventually go away. These are called Braxton Hicks contractions, or   false labor. Contractions may last for hours, days, or even weeks before true labor sets in. If contractions come at regular intervals, intensify, or become painful, it is best to be seen by your caregiver.   SIGNS OF LABOR    Menstrual-like cramps.   Contractions that are 5 minutes apart or less.   Contractions that start on the top of the uterus and spread down to the lower abdomen and back.   A sense of increased pelvic pressure or back pain.   A watery or bloody mucus discharge that comes from the vagina.  If you have any of these signs before the 37th week of pregnancy, call your caregiver right away. You need to go to the hospital to get checked immediately.  HOME CARE INSTRUCTIONS    Avoid all  smoking, herbs, alcohol, and unprescribed drugs. These chemicals affect the formation and growth of the baby.   Follow your caregiver's instructions regarding medicine use. There are medicines that are either safe or unsafe to take during pregnancy.   Exercise only as directed by your caregiver. Experiencing uterine cramps is a good sign to stop exercising.   Continue to eat regular, healthy meals.   Wear a good support bra for breast tenderness.   Do not use hot tubs, steam rooms, or saunas.   Wear your seat belt at all times when driving.   Avoid raw meat, uncooked cheese, cat litter boxes, and soil used by cats. These carry germs that can cause birth defects in the baby.   Take your prenatal vitamins.   Try taking a stool softener (if your caregiver approves) if you develop constipation. Eat more high-fiber foods, such as fresh vegetables or fruit and whole grains. Drink plenty of fluids to keep your urine clear or pale yellow.   Take warm sitz baths to soothe any pain or discomfort caused by hemorrhoids. Use hemorrhoid cream if your caregiver approves.   If you develop varicose veins, wear support hose. Elevate your feet for 15 minutes, 3 4 times a day. Limit salt in your diet.   Avoid heavy lifting, wear low heal shoes, and practice good posture.   Rest a lot with your legs elevated if you have leg cramps or low back pain.   Visit your dentist if you have not gone during your pregnancy. Use a soft toothbrush to brush your teeth and be gentle when you floss.   A sexual relationship may be continued unless your caregiver directs you otherwise.   Do not travel far distances unless it is absolutely necessary and only with the approval of your caregiver.   Take prenatal classes to understand, practice, and ask questions about the labor and delivery.   Make a trial run to the hospital.   Pack your hospital bag.   Prepare the baby's nursery.   Continue to go to all your prenatal visits as directed  by your caregiver.  SEEK MEDICAL CARE IF:   You are unsure if you are in labor or if your water has broken.   You have dizziness.   You have mild pelvic cramps, pelvic pressure, or nagging pain in your abdominal area.   You have persistent nausea, vomiting, or diarrhea.   You have a bad smelling vaginal discharge.   You have pain with urination.  SEEK IMMEDIATE MEDICAL CARE IF:    You have a fever.   You are leaking fluid from your vagina.   You have spotting or bleeding from your vagina.     You have severe abdominal cramping or pain.   You have rapid weight loss or gain.   You have shortness of breath with chest pain.   You notice sudden or extreme swelling of your face, hands, ankles, feet, or legs.   You have not felt your baby move in over an hour.   You have severe headaches that do not go away with medicine.   You have vision changes.  Document Released: 09/16/2001 Document Revised: 05/25/2013 Document Reviewed: 11/23/2012  [employer] Patient Information 2014 [employer]

## 2013-10-06 NOTE — L&D Delivery Note (Signed)
Delivery Note At 6:06 AM a viable female was delivered via Vaginal, Spontaneous Delivery (Presentation: ; Occiput Posterior).  APGAR: 7, 9; weight 7 lb 12.2 oz (3521 g).   Placenta status: Intact, Spontaneous.  Cord: 3 vessels with the following complications: None.    Anesthesia: Epidural  Episiotomy: None Lacerations: None Suture Repair: na Est. Blood Loss (mL): 250  Mom to postpartum.  Baby to Couplet care / Skin to Skin.  Abelardo Seidner L 10/24/2013, 9:13 AM

## 2013-10-12 ENCOUNTER — Encounter: Payer: Self-pay | Admitting: Family Medicine

## 2013-10-12 ENCOUNTER — Ambulatory Visit (INDEPENDENT_AMBULATORY_CARE_PROVIDER_SITE_OTHER): Payer: Medicaid Other | Admitting: Family Medicine

## 2013-10-12 VITALS — BP 111/64 | Temp 98.5°F | Wt 169.8 lb

## 2013-10-12 DIAGNOSIS — Z23 Encounter for immunization: Secondary | ICD-10-CM

## 2013-10-12 DIAGNOSIS — O09299 Supervision of pregnancy with other poor reproductive or obstetric history, unspecified trimester: Secondary | ICD-10-CM

## 2013-10-12 LAB — POCT URINALYSIS DIP (DEVICE)
Bilirubin Urine: NEGATIVE
GLUCOSE, UA: NEGATIVE mg/dL
HGB URINE DIPSTICK: NEGATIVE
Ketones, ur: NEGATIVE mg/dL
NITRITE: NEGATIVE
PROTEIN: NEGATIVE mg/dL
Specific Gravity, Urine: 1.025 (ref 1.005–1.030)
UROBILINOGEN UA: 0.2 mg/dL (ref 0.0–1.0)
pH: 6.5 (ref 5.0–8.0)

## 2013-10-12 MED ORDER — TETANUS-DIPHTH-ACELL PERTUSSIS 5-2.5-18.5 LF-MCG/0.5 IM SUSP: 0.5000 mL | Freq: Once | INTRAMUSCULAR | Status: AC

## 2013-10-12 NOTE — Patient Instructions (Signed)
Third Trimester of Pregnancy  The third trimester is from week 29 through week 42, months 7 through 9. The third trimester is a time when the fetus is growing rapidly. At the end of the ninth month, the fetus is about 20 inches in length and weighs 6 10 pounds.   BODY CHANGES  Your body goes through many changes during pregnancy. The changes vary from woman to woman.    Your weight will continue to increase. You can expect to gain 25 35 pounds (11 16 kg) by the end of the pregnancy.   You may begin to get stretch marks on your hips, abdomen, and breasts.   You may urinate more often because the fetus is moving lower into your pelvis and pressing on your bladder.   You may develop or continue to have heartburn as a result of your pregnancy.   You may develop constipation because certain hormones are causing the muscles that push waste through your intestines to slow down.   You may develop hemorrhoids or swollen, bulging veins (varicose veins).   You may have pelvic pain because of the weight gain and pregnancy hormones relaxing your joints between the bones in your pelvis. Back aches may result from over exertion of the muscles supporting your posture.   Your breasts will continue to grow and be tender. A yellow discharge may leak from your breasts called colostrum.   Your belly button may stick out.   You may feel short of breath because of your expanding uterus.   You may notice the fetus "dropping," or moving lower in your abdomen.   You may have a bloody mucus discharge. This usually occurs a few days to a week before labor begins.   Your cervix becomes thin and soft (effaced) near your due date.  WHAT TO EXPECT AT YOUR PRENATAL EXAMS   You will have prenatal exams every 2 weeks until week 36. Then, you will have weekly prenatal exams. During a routine prenatal visit:   You will be weighed to make sure you and the fetus are growing normally.   Your blood pressure is taken.   Your abdomen will be  measured to track your baby's growth.   The fetal heartbeat will be listened to.   Any test results from the previous visit will be discussed.   You may have a cervical check near your due date to see if you have effaced.  At around 36 weeks, your caregiver will check your cervix. At the same time, your caregiver will also perform a test on the secretions of the vaginal tissue. This test is to determine if a type of bacteria, Group B streptococcus, is present. Your caregiver will explain this further.  Your caregiver may ask you:   What your birth plan is.   How you are feeling.   If you are feeling the baby move.   If you have had any abnormal symptoms, such as leaking fluid, bleeding, severe headaches, or abdominal cramping.   If you have any questions.  Other tests or screenings that may be performed during your third trimester include:   Blood tests that check for low iron levels (anemia).   Fetal testing to check the health, activity level, and growth of the fetus. Testing is done if you have certain medical conditions or if there are problems during the pregnancy.  FALSE LABOR  You may feel small, irregular contractions that eventually go away. These are called Braxton Hicks contractions, or   false labor. Contractions may last for hours, days, or even weeks before true labor sets in. If contractions come at regular intervals, intensify, or become painful, it is best to be seen by your caregiver.   SIGNS OF LABOR    Menstrual-like cramps.   Contractions that are 5 minutes apart or less.   Contractions that start on the top of the uterus and spread down to the lower abdomen and back.   A sense of increased pelvic pressure or back pain.   A watery or bloody mucus discharge that comes from the vagina.  If you have any of these signs before the 37th week of pregnancy, call your caregiver right away. You need to go to the hospital to get checked immediately.  HOME CARE INSTRUCTIONS    Avoid all  smoking, herbs, alcohol, and unprescribed drugs. These chemicals affect the formation and growth of the baby.   Follow your caregiver's instructions regarding medicine use. There are medicines that are either safe or unsafe to take during pregnancy.   Exercise only as directed by your caregiver. Experiencing uterine cramps is a good sign to stop exercising.   Continue to eat regular, healthy meals.   Wear a good support bra for breast tenderness.   Do not use hot tubs, steam rooms, or saunas.   Wear your seat belt at all times when driving.   Avoid raw meat, uncooked cheese, cat litter boxes, and soil used by cats. These carry germs that can cause birth defects in the baby.   Take your prenatal vitamins.   Try taking a stool softener (if your caregiver approves) if you develop constipation. Eat more high-fiber foods, such as fresh vegetables or fruit and whole grains. Drink plenty of fluids to keep your urine clear or pale yellow.   Take warm sitz baths to soothe any pain or discomfort caused by hemorrhoids. Use hemorrhoid cream if your caregiver approves.   If you develop varicose veins, wear support hose. Elevate your feet for 15 minutes, 3 4 times a day. Limit salt in your diet.   Avoid heavy lifting, wear low heal shoes, and practice good posture.   Rest a lot with your legs elevated if you have leg cramps or low back pain.   Visit your dentist if you have not gone during your pregnancy. Use a soft toothbrush to brush your teeth and be gentle when you floss.   A sexual relationship may be continued unless your caregiver directs you otherwise.   Do not travel far distances unless it is absolutely necessary and only with the approval of your caregiver.   Take prenatal classes to understand, practice, and ask questions about the labor and delivery.   Make a trial run to the hospital.   Pack your hospital bag.   Prepare the baby's nursery.   Continue to go to all your prenatal visits as directed  by your caregiver.  SEEK MEDICAL CARE IF:   You are unsure if you are in labor or if your water has broken.   You have dizziness.   You have mild pelvic cramps, pelvic pressure, or nagging pain in your abdominal area.   You have persistent nausea, vomiting, or diarrhea.   You have a bad smelling vaginal discharge.   You have pain with urination.  SEEK IMMEDIATE MEDICAL CARE IF:    You have a fever.   You are leaking fluid from your vagina.   You have spotting or bleeding from your vagina.     You have severe abdominal cramping or pain.   You have rapid weight loss or gain.   You have shortness of breath with chest pain.   You notice sudden or extreme swelling of your face, hands, ankles, feet, or legs.   You have not felt your baby move in over an hour.   You have severe headaches that do not go away with medicine.   You have vision changes.  Document Released: 09/16/2001 Document Revised: 05/25/2013 Document Reviewed: 11/23/2012  [employer] Patient Information 2014 [employer]

## 2013-10-12 NOTE — Progress Notes (Signed)
P= 79,  C/o scant bright red bleeding once yesterday noted on panties- denies intercourse, c/o cramping that has worsened since bleeding.

## 2013-10-12 NOTE — Progress Notes (Signed)
+  FM, no LOF, minimal bleeding yesterday in drawers, occassional ctx rare  Shawna Michael is a 23 y.o. [id number] at 6480w4d here for ROB visit.   GBS today +Rash on abdomen, and groin. Reports hx of fungal infection not responsive to topical and PO. Plan to reevaluate PP.  SSE: no blood in vault  Discussed with Patient:  - Plans to breast feed.  All questions answered. - Continue prenatal vitamins. - Reviewed fetal kick counts Pt to perform daily at a time when the baby is active, lie laterally with both hands on belly in quiet room and count all movements (hiccups, shoulder rolls, obvious kicks, etc); pt is to report to clinic MAU for less than 10 movements felt in a 2 hour time period-pt told as soon as she counts 10 movements the count is complete.  - Routine precautions discussed (depression, infection s/s).   Patient provided with all pertinent phone numbers for emergencies. - RTC for any VB, regular, painful cramps/ctxs occurring at a rate of >2/10 min, fever (100.5 or higher), n/v/d, any pain that is unresolving or worsening, LOF, decreased fetal movement, CP, SOB, edema -RTC in one week for next visit.  Problems: Patient Active Problem List   Diagnosis Date Noted  . 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 Pregnancy with other poor reproductive history 05/13/2013    To Do: 1. GBS Today  [ ]  Vaccines: Flu:  Tdap: 1/7 [ ]  BCM: mirena [ ]  Readiness: baby has a place to sleep, car seat, other baby necessities.  Edu: [x ] PTL precautions; [ ]  BF class; [ ]  childbirth class; [ ]   BF counseling;

## 2013-10-15 LAB — OB RESULTS CONSOLE GBS: GBS: NEGATIVE

## 2013-10-15 LAB — CULTURE, BETA STREP (GROUP B ONLY)

## 2013-10-16 ENCOUNTER — Inpatient Hospital Stay (HOSPITAL_COMMUNITY)
Admission: AD | Admit: 2013-10-16 | Discharge: 2013-10-16 | Disposition: A | Discharge status: Home / Self Care | Payer: Medicaid Other | Source: Ambulatory Visit | Attending: Obstetrics & Gynecology | Admitting: Obstetrics & Gynecology

## 2013-10-16 ENCOUNTER — Encounter (HOSPITAL_COMMUNITY): Payer: Self-pay

## 2013-10-16 DIAGNOSIS — O99891 Other specified diseases and conditions complicating pregnancy: Secondary | ICD-10-CM | POA: Insufficient documentation

## 2013-10-16 DIAGNOSIS — O479 False labor, unspecified: Secondary | ICD-10-CM | POA: Insufficient documentation

## 2013-10-16 DIAGNOSIS — O9989 Other specified diseases and conditions complicating pregnancy, childbirth and the puerperium: Secondary | ICD-10-CM

## 2013-10-16 DIAGNOSIS — Z87891 Personal history of nicotine dependence: Secondary | ICD-10-CM | POA: Insufficient documentation

## 2013-10-16 LAB — AMNISURE RUPTURE OF MEMBRANE (ROM) NOT AT ARMC: Amnisure ROM: NEGATIVE

## 2013-10-16 NOTE — MAU Provider Note (Signed)
  History     CSN: [id number]  Arrival date and time: 10/16/13 1332   None     Chief Complaint  Patient presents with  . Labor Eval   HPI Pt is a 23 y.o. [id number] at 4259w1d who presents for evaluation of contractions and fluid leakage during a stressful incident. Patient's car was being repossessed when she started contracting and felt a gush of fluid. The police called EMS for her and she was brought in. She reports still feeling some mild contractions  OB History   Grav Para Term Preterm Abortions TAB SAB Ect Mult Living   6 2  2 3  3   1       Past Medical History  Diagnosis Date  . Complication of anesthesia   . Spinal headache     Past Surgical History  Procedure Laterality Date  . Mandible fracture surgery  2003    Family History  Problem Relation Age of Onset  . Adopted: Yes    History  Substance Use Topics  . Smoking status: Former Smoker -- 0.25 packs/day    Quit date: 07/20/2013  . Smokeless tobacco: Never Used     Comment: 05/11/13 is cutting back on smoking  . Alcohol Use: No    Allergies:  Allergies  Allergen Reactions  . Valium [Diazepam] Other (See Comments)    Muscles "locked up"    No prescriptions prior to admission    ROS Physical Exam   Blood pressure 110/59, pulse 83, temperature 97.4 F (36.3 C), temperature source Oral, resp. rate 18, last menstrual period 02/12/2013.  Physical Exam  Nursing note and vitals reviewed. Constitutional: She is oriented to person, place, and time. She appears well-developed and well-nourished. No distress.  HENT:  Head: Normocephalic and atraumatic.  Eyes: Conjunctivae are normal. Right eye exhibits no discharge. Left eye exhibits no discharge. No scleral icterus.  Neck: Normal range of motion.  Cardiovascular: Intact distal pulses.   Respiratory: Effort normal.  GI:  Gravid uterus  Genitourinary: Vagina normal and uterus normal. No vaginal discharge found.  Musculoskeletal: Normal range of  motion.  Neurological: She is alert and oriented to person, place, and time.  Skin: Skin is warm and dry. She is not diaphoretic.  Psychiatric: She has a normal mood and affect.  Dilation: Closed (outer os 2cm, funnels to closed ) Effacement (%): Thick Cervical Position: Posterior Station: -3 Exam by:: Ginger Morris RN FWB: reactive NST Toco: not contracting  MAU Course  Procedures  MDM amnisure negative  Assessment and Plan  Pt presents for labor eval, no signs of labor and not ruptured. Likely contractions and urine loss related to stress of having car repossessed. Discharge with labor precautions.  [name], Verlene Glantz 10/16/2013, 3:29 PM

## 2013-10-16 NOTE — MAU Note (Signed)
Pt presents via EMS complaining of contractions every 3-5 minutes apart. States her water may have broke but she's not sure. Denies vaginal bleeding or discharge.

## 2013-10-16 NOTE — Discharge Instructions (Signed)
Fetal Movement Counts °Patient Name: __________________________________________________ Patient Due Date: ____________________ °Performing a fetal movement count is highly recommended in high-risk pregnancies, but it is good for every pregnant woman to do. Your caregiver may ask you to start counting fetal movements at 28 weeks of the pregnancy. Fetal movements often increase: °· After eating a full meal. °· After physical activity. °· After eating or drinking something sweet or cold. °· At rest. °Pay attention to when you feel the baby is most active. This will help you notice a pattern of your baby's sleep and wake cycles and what factors contribute to an increase in fetal movement. It is important to perform a fetal movement count at the same time each day when your baby is normally most active.  °HOW TO COUNT FETAL MOVEMENTS °1. Find a quiet and comfortable area to sit or lie down on your left side. Lying on your left side provides the best blood and oxygen circulation to your baby. °2. Write down the day and time on a sheet of paper or in a journal. °3. Start counting kicks, flutters, swishes, rolls, or jabs in a 2 hour period. You should feel at least 10 movements within 2 hours. °4. If you do not feel 10 movements in 2 hours, wait 2 3 hours and count again. Look for a change in the pattern or not enough counts in 2 hours. °SEEK MEDICAL CARE IF: °· You feel less than 10 counts in 2 hours, tried twice. °· There is no movement in over an hour. °· The pattern is changing or taking longer each day to reach 10 counts in 2 hours. °· You feel the baby is not moving as he or she usually does. °Date: ____________ Movements: ____________ Start time: ____________ Finish time: ____________  °Date: ____________ Movements: ____________ Start time: ____________ Finish time: ____________ °Date: ____________ Movements: ____________ Start time: ____________ Finish time: ____________ °Date: ____________ Movements: ____________  Start time: ____________ Finish time: ____________ °Date: ____________ Movements: ____________ Start time: ____________ Finish time: ____________ °Date: ____________ Movements: ____________ Start time: ____________ Finish time: ____________ °Date: ____________ Movements: ____________ Start time: ____________ Finish time: ____________ °Date: ____________ Movements: ____________ Start time: ____________ Finish time: ____________  °Date: ____________ Movements: ____________ Start time: ____________ Finish time: ____________ °Date: ____________ Movements: ____________ Start time: ____________ Finish time: ____________ °Date: ____________ Movements: ____________ Start time: ____________ Finish time: ____________ °Date: ____________ Movements: ____________ Start time: ____________ Finish time: ____________ °Date: ____________ Movements: ____________ Start time: ____________ Finish time: ____________ °Date: ____________ Movements: ____________ Start time: ____________ Finish time: ____________ °Date: ____________ Movements: ____________ Start time: ____________ Finish time: ____________  °Date: ____________ Movements: ____________ Start time: ____________ Finish time: ____________ °Date: ____________ Movements: ____________ Start time: ____________ Finish time: ____________ °Date: ____________ Movements: ____________ Start time: ____________ Finish time: ____________ °Date: ____________ Movements: ____________ Start time: ____________ Finish time: ____________ °Date: ____________ Movements: ____________ Start time: ____________ Finish time: ____________ °Date: ____________ Movements: ____________ Start time: ____________ Finish time: ____________ °Date: ____________ Movements: ____________ Start time: ____________ Finish time: ____________  °Date: ____________ Movements: ____________ Start time: ____________ Finish time: ____________ °Date: ____________ Movements: ____________ Start time: ____________ Finish time:  ____________ °Date: ____________ Movements: ____________ Start time: ____________ Finish time: ____________ °Date: ____________ Movements: ____________ Start time: ____________ Finish time: ____________ °Date: ____________ Movements: ____________ Start time: ____________ Finish time: ____________ °Date: ____________ Movements: ____________ Start time: ____________ Finish time: ____________ °Date: ____________ Movements: ____________ Start time: ____________ Finish time: ____________  °Date: ____________ Movements: ____________ Start time: ____________ Finish   time: ____________ °Date: ____________ Movements: ____________ Start time: ____________ Finish time: ____________ °Date: ____________ Movements: ____________ Start time: ____________ Finish time: ____________ °Date: ____________ Movements: ____________ Start time: ____________ Finish time: ____________ °Date: ____________ Movements: ____________ Start time: ____________ Finish time: ____________ °Date: ____________ Movements: ____________ Start time: ____________ Finish time: ____________ °Date: ____________ Movements: ____________ Start time: ____________ Finish time: ____________  °Date: ____________ Movements: ____________ Start time: ____________ Finish time: ____________ °Date: ____________ Movements: ____________ Start time: ____________ Finish time: ____________ °Date: ____________ Movements: ____________ Start time: ____________ Finish time: ____________ °Date: ____________ Movements: ____________ Start time: ____________ Finish time: ____________ °Date: ____________ Movements: ____________ Start time: ____________ Finish time: ____________ °Date: ____________ Movements: ____________ Start time: ____________ Finish time: ____________ °Date: ____________ Movements: ____________ Start time: ____________ Finish time: ____________  °Date: ____________ Movements: ____________ Start time: ____________ Finish time: ____________ °Date: ____________ Movements:  ____________ Start time: ____________ Finish time: ____________ °Date: ____________ Movements: ____________ Start time: ____________ Finish time: ____________ °Date: ____________ Movements: ____________ Start time: ____________ Finish time: ____________ °Date: ____________ Movements: ____________ Start time: ____________ Finish time: ____________ °Date: ____________ Movements: ____________ Start time: ____________ Finish time: ____________ °Date: ____________ Movements: ____________ Start time: ____________ Finish time: ____________  °Date: ____________ Movements: ____________ Start time: ____________ Finish time: ____________ °Date: ____________ Movements: ____________ Start time: ____________ Finish time: ____________ °Date: ____________ Movements: ____________ Start time: ____________ Finish time: ____________ °Date: ____________ Movements: ____________ Start time: ____________ Finish time: ____________ °Date: ____________ Movements: ____________ Start time: ____________ Finish time: ____________ °Date: ____________ Movements: ____________ Start time: ____________ Finish time: ____________ °Document Released: 10/22/2006 Document Revised: 09/08/2012 Document Reviewed: 07/19/2012 °[employer]® Patient Information ©2014 [employer]. °Braxton Hicks Contractions °Pregnancy is commonly associated with contractions of the uterus throughout the pregnancy. Towards the end of pregnancy (32 to 34 weeks), these contractions (Braxton Hicks) can develop more often and may become more forceful. This is not true labor because these contractions do not result in opening (dilatation) and thinning of the cervix. They are sometimes difficult to tell apart from true labor because these contractions can be forceful and people have different pain tolerances. You should not feel embarrassed if you go to the hospital with false labor. Sometimes, the only way to tell if you are in true labor is for your caregiver to follow the changes in  the cervix. °How to tell the difference between true and false labor: °· False labor. °· The contractions of false labor are usually shorter, irregular and not as hard as those of true labor. °· They are often felt in the front of the lower abdomen and in the groin. °· They may leave with walking around or changing positions while lying down. °· They get weaker and are shorter lasting as time goes on. °· These contractions are usually irregular. °· They do not usually become progressively stronger, regular and closer together as with true labor. °· True labor. °· Contractions in true labor last 30 to 70 seconds, become very regular, usually become more intense, and increase in frequency. °· They do not go away with walking. °· The discomfort is usually felt in the top of the uterus and spreads to the lower abdomen and low back. °· True labor can be determined by your caregiver with an exam. This will show that the cervix is dilating and getting thinner. °If there are no prenatal problems or other health problems associated with the pregnancy, it is completely safe to be sent home with false labor and await the onset of true labor. °  HOME CARE INSTRUCTIONS  °· Keep up with your usual exercises and instructions. °· Take medications as directed. °· Keep your regular prenatal appointment. °· Eat and drink lightly if you think you are going into labor. °· If BH contractions are making you uncomfortable: °· Change your activity position from lying down or resting to walking/walking to resting. °· Sit and rest in a tub of warm water. °· Drink 2 to 3 glasses of water. Dehydration may cause B-H contractions. °· Do slow and deep breathing several times an hour. °SEEK IMMEDIATE MEDICAL CARE IF:  °· Your contractions continue to become stronger, more regular, and closer together. °· You have a gushing, burst or leaking of fluid from the vagina. °· An oral temperature above 102° F (38.9° C) develops. °· You have passage of  blood-tinged mucus. °· You develop vaginal bleeding. °· You develop continuous belly (abdominal) pain. °· You have low back pain that you never had before. °· You feel the baby's head pushing down causing pelvic pressure. °· The baby is not moving as much as it used to. °Document Released: 09/22/2005 Document Revised: 12/15/2011 Document Reviewed: 07/04/2013 °[employer]® Patient Information ©2014 [employer]. °Third Trimester of Pregnancy °The third trimester is from week 29 through week 42, months 7 through 9. The third trimester is a time when the fetus is growing rapidly. At the end of the ninth month, the fetus is about 20 inches in length and weighs 6 10 pounds.  °BODY CHANGES °Your body goes through many changes during pregnancy. The changes vary from woman to woman.  °· Your weight will continue to increase. You can expect to gain 25 35 pounds (11 16 kg) by the end of the pregnancy. °· You may begin to get stretch marks on your hips, abdomen, and breasts. °· You may urinate more often because the fetus is moving lower into your pelvis and pressing on your bladder. °· You may develop or continue to have heartburn as a result of your pregnancy. °· You may develop constipation because certain hormones are causing the muscles that push waste through your intestines to slow down. °· You may develop hemorrhoids or swollen, bulging veins (varicose veins). °· You may have pelvic pain because of the weight gain and pregnancy hormones relaxing your joints between the bones in your pelvis. Back aches may result from over exertion of the muscles supporting your posture. °· Your breasts will continue to grow and be tender. A yellow discharge may leak from your breasts called colostrum. °· Your belly button may stick out. °· You may feel short of breath because of your expanding uterus. °· You may notice the fetus "dropping," or moving lower in your abdomen. °· You may have a bloody mucus discharge. This usually occurs a  few days to a week before labor begins. °· Your cervix becomes thin and soft (effaced) near your due date. °WHAT TO EXPECT AT YOUR PRENATAL EXAMS  °You will have prenatal exams every 2 weeks until week 36. Then, you will have weekly prenatal exams. During a routine prenatal visit: °· You will be weighed to make sure you and the fetus are growing normally. °· Your blood pressure is taken. °· Your abdomen will be measured to track your baby's growth. °· The fetal heartbeat will be listened to. °· Any test results from the previous visit will be discussed. °· You may have a cervical check near your due date to see if you have effaced. °At around 36 weeks,   your caregiver will check your cervix. At the same time, your caregiver will also perform a test on the secretions of the vaginal tissue. This test is to determine if a type of bacteria, Group B streptococcus, is present. Your caregiver will explain this further. °Your caregiver may ask you: °· What your birth plan is. °· How you are feeling. °· If you are feeling the baby move. °· If you have had any abnormal symptoms, such as leaking fluid, bleeding, severe headaches, or abdominal cramping. °· If you have any questions. °Other tests or screenings that may be performed during your third trimester include: °· Blood tests that check for low iron levels (anemia). °· Fetal testing to check the health, activity level, and growth of the fetus. Testing is done if you have certain medical conditions or if there are problems during the pregnancy. °FALSE LABOR °You may feel small, irregular contractions that eventually go away. These are called Braxton Hicks contractions, or false labor. Contractions may last for hours, days, or even weeks before true labor sets in. If contractions come at regular intervals, intensify, or become painful, it is best to be seen by your caregiver.  °SIGNS OF LABOR  °· Menstrual-like cramps. °· Contractions that are 5 minutes apart or  less. °· Contractions that start on the top of the uterus and spread down to the lower abdomen and back. °· A sense of increased pelvic pressure or back pain. °· A watery or bloody mucus discharge that comes from the vagina. °If you have any of these signs before the 37th week of pregnancy, call your caregiver right away. You need to go to the hospital to get checked immediately. °HOME CARE INSTRUCTIONS  °· Avoid all smoking, herbs, alcohol, and unprescribed drugs. These chemicals affect the formation and growth of the baby. °· Follow your caregiver's instructions regarding medicine use. There are medicines that are either safe or unsafe to take during pregnancy. °· Exercise only as directed by your caregiver. Experiencing uterine cramps is a good sign to stop exercising. °· Continue to eat regular, healthy meals. °· Wear a good support bra for breast tenderness. °· Do not use hot tubs, steam rooms, or saunas. °· Wear your seat belt at all times when driving. °· Avoid raw meat, uncooked cheese, cat litter boxes, and soil used by cats. These carry germs that can cause birth defects in the baby. °· Take your prenatal vitamins. °· Try taking a stool softener (if your caregiver approves) if you develop constipation. Eat more high-fiber foods, such as fresh vegetables or fruit and whole grains. Drink plenty of fluids to keep your urine clear or pale yellow. °· Take warm sitz baths to soothe any pain or discomfort caused by hemorrhoids. Use hemorrhoid cream if your caregiver approves. °· If you develop varicose veins, wear support hose. Elevate your feet for 15 minutes, 3 4 times a day. Limit salt in your diet. °· Avoid heavy lifting, wear low heal shoes, and practice good posture. °· Rest a lot with your legs elevated if you have leg cramps or low back pain. °· Visit your dentist if you have not gone during your pregnancy. Use a soft toothbrush to brush your teeth and be gentle when you floss. °· A sexual relationship  may be continued unless your caregiver directs you otherwise. °· Do not travel far distances unless it is absolutely necessary and only with the approval of your caregiver. °· Take prenatal classes to understand, practice, and ask questions   about the labor and delivery. °· Make a trial run to the hospital. °· Pack your hospital bag. °· Prepare the baby's nursery. °· Continue to go to all your prenatal visits as directed by your caregiver. °SEEK MEDICAL CARE IF: °· You are unsure if you are in labor or if your water has broken. °· You have dizziness. °· You have mild pelvic cramps, pelvic pressure, or nagging pain in your abdominal area. °· You have persistent nausea, vomiting, or diarrhea. °· You have a bad smelling vaginal discharge. °· You have pain with urination. °SEEK IMMEDIATE MEDICAL CARE IF:  °· You have a fever. °· You are leaking fluid from your vagina. °· You have spotting or bleeding from your vagina. °· You have severe abdominal cramping or pain. °· You have rapid weight loss or gain. °· You have shortness of breath with chest pain. °· You notice sudden or extreme swelling of your face, hands, ankles, feet, or legs. °· You have not felt your baby move in over an hour. °· You have severe headaches that do not go away with medicine. °· You have vision changes. °Document Released: 09/16/2001 Document Revised: 05/25/2013 Document Reviewed: 11/23/2012 °[employer]® Patient Information ©2014 [employer]. ° °

## 2013-10-19 ENCOUNTER — Encounter: Payer: Self-pay | Admitting: Advanced Practice Midwife

## 2013-10-19 ENCOUNTER — Ambulatory Visit (INDEPENDENT_AMBULATORY_CARE_PROVIDER_SITE_OTHER): Payer: Medicaid Other | Admitting: Advanced Practice Midwife

## 2013-10-19 ENCOUNTER — Encounter: Payer: Self-pay | Admitting: Family Medicine

## 2013-10-19 VITALS — BP 114/72 | Temp 97.1°F | Wt 170.3 lb

## 2013-10-19 DIAGNOSIS — O09299 Supervision of pregnancy with other poor reproductive or obstetric history, unspecified trimester: Secondary | ICD-10-CM

## 2013-10-19 LAB — POCT URINALYSIS DIP (DEVICE)
Bilirubin Urine: NEGATIVE
Glucose, UA: NEGATIVE mg/dL
Hgb urine dipstick: NEGATIVE
Ketones, ur: NEGATIVE mg/dL
NITRITE: NEGATIVE
PH: 7 (ref 5.0–8.0)
Protein, ur: NEGATIVE mg/dL
Specific Gravity, Urine: 1.025 (ref 1.005–1.030)
UROBILINOGEN UA: 0.2 mg/dL (ref 0.0–1.0)

## 2013-10-19 NOTE — Progress Notes (Signed)
Doing well. Worried about size of baby. Wants membrane sweeping. Cervix now open, membranes swept per request. Labor precautions

## 2013-10-19 NOTE — Progress Notes (Signed)
P = 83    Pt desires cx exam today.

## 2013-10-19 NOTE — Patient Instructions (Signed)

## 2013-10-22 ENCOUNTER — Encounter (HOSPITAL_COMMUNITY): Payer: Self-pay | Admitting: Family

## 2013-10-22 ENCOUNTER — Inpatient Hospital Stay (HOSPITAL_COMMUNITY)
Admission: AD | Admit: 2013-10-22 | Discharge: 2013-10-22 | Disposition: A | Discharge status: Home / Self Care | Payer: Medicaid Other | Source: Ambulatory Visit | Attending: Obstetrics & Gynecology | Admitting: Obstetrics & Gynecology

## 2013-10-22 DIAGNOSIS — O368190 Decreased fetal movements, unspecified trimester, not applicable or unspecified: Secondary | ICD-10-CM | POA: Insufficient documentation

## 2013-10-22 LAB — URINALYSIS, ROUTINE W REFLEX MICROSCOPIC
Bilirubin Urine: NEGATIVE
Glucose, UA: NEGATIVE mg/dL
Ketones, ur: NEGATIVE mg/dL
Nitrite: NEGATIVE
Protein, ur: NEGATIVE mg/dL
Specific Gravity, Urine: 1.015 (ref 1.005–1.030)
Urobilinogen, UA: 0.2 mg/dL (ref 0.0–1.0)
pH: 7.5 (ref 5.0–8.0)

## 2013-10-22 LAB — URINE MICROSCOPIC-ADD ON

## 2013-10-22 NOTE — MAU Note (Signed)
23 yo, G6P1 at 8573w0d, presents to MAU with c/o decreased FM since last 2200. Denies VB; reports increased mucous discharge and intermittent contractions. Denies HSV.

## 2013-10-23 ENCOUNTER — Inpatient Hospital Stay (HOSPITAL_COMMUNITY)
Admission: AD | Admit: 2013-10-23 | Discharge: 2013-10-25 | DRG: 774 | Disposition: A | Discharge status: Home / Self Care | Payer: Medicaid Other | Source: Ambulatory Visit | Attending: Obstetrics & Gynecology | Admitting: Obstetrics & Gynecology

## 2013-10-23 ENCOUNTER — Inpatient Hospital Stay (HOSPITAL_COMMUNITY): Payer: Medicaid Other | Admitting: Anesthesiology

## 2013-10-23 ENCOUNTER — Encounter (HOSPITAL_COMMUNITY): Payer: Medicaid Other | Admitting: Anesthesiology

## 2013-10-23 DIAGNOSIS — Z87891 Personal history of nicotine dependence: Secondary | ICD-10-CM

## 2013-10-23 DIAGNOSIS — O99344 Other mental disorders complicating childbirth: Secondary | ICD-10-CM | POA: Diagnosis present

## 2013-10-23 DIAGNOSIS — O864 Pyrexia of unknown origin following delivery: Secondary | ICD-10-CM | POA: Diagnosis not present

## 2013-10-23 DIAGNOSIS — O09299 Supervision of pregnancy with other poor reproductive or obstetric history, unspecified trimester: Secondary | ICD-10-CM

## 2013-10-23 DIAGNOSIS — Z3490 Encounter for supervision of normal pregnancy, unspecified, unspecified trimester: Secondary | ICD-10-CM

## 2013-10-23 DIAGNOSIS — F1210 Cannabis abuse, uncomplicated: Secondary | ICD-10-CM | POA: Diagnosis present

## 2013-10-23 LAB — CBC
HCT: 33 % — ABNORMAL LOW (ref 36.0–46.0)
Hemoglobin: 11.2 g/dL — ABNORMAL LOW (ref 12.0–15.0)
MCH: 30.3 pg (ref 26.0–34.0)
MCHC: 33.9 g/dL (ref 30.0–36.0)
MCV: 89.2 fL (ref 78.0–100.0)
Platelets: 192 10*3/uL (ref 150–400)
RBC: 3.7 MIL/uL — ABNORMAL LOW (ref 3.87–5.11)
RDW: 13.7 % (ref 11.5–15.5)
WBC: 15.1 10*3/uL — ABNORMAL HIGH (ref 4.0–10.5)

## 2013-10-23 LAB — URINE CULTURE

## 2013-10-23 MED ORDER — OXYCODONE-ACETAMINOPHEN 5-325 MG PO TABS: 1.0000 | ORAL_TABLET | ORAL | Status: DC | PRN

## 2013-10-23 MED ORDER — CITRIC ACID-SODIUM CITRATE 334-500 MG/5ML PO SOLN
30.0000 mL | ORAL | Status: DC | PRN
Start: 2013-10-23 — End: 2013-10-24
  Administered 2013-10-24: 30 mL via ORAL
  Filled 2013-10-23: qty 15

## 2013-10-23 MED ORDER — LIDOCAINE HCL (PF) 1 % IJ SOLN
30.0000 mL | INTRAMUSCULAR | Status: DC | PRN
  Filled 2013-10-23: qty 30

## 2013-10-23 MED ORDER — OXYTOCIN BOLUS FROM INFUSION
500.0000 mL | INTRAVENOUS | Status: DC
  Administered 2013-10-24: 500 mL via INTRAVENOUS

## 2013-10-23 MED ORDER — PHENYLEPHRINE 40 MCG/ML (10ML) SYRINGE FOR IV PUSH (FOR BLOOD PRESSURE SUPPORT): 80.0000 ug | PREFILLED_SYRINGE | INTRAVENOUS | Status: DC | PRN

## 2013-10-23 MED ORDER — LACTATED RINGERS IV SOLN
500.0000 mL | Freq: Once | INTRAVENOUS | Status: AC
Start: 2013-10-23 — End: 2013-10-23
  Administered 2013-10-23: 500 mL via INTRAVENOUS

## 2013-10-23 MED ORDER — OXYTOCIN 40 UNITS IN LACTATED RINGERS INFUSION - SIMPLE MED
62.5000 mL/h | INTRAVENOUS | Status: DC
  Administered 2013-10-24: 62.5 mL/h via INTRAVENOUS
  Filled 2013-10-23: qty 1000

## 2013-10-23 MED ORDER — LACTATED RINGERS IV SOLN
500.0000 mL | INTRAVENOUS | Status: DC | PRN
Start: 2013-10-23 — End: 2013-10-24

## 2013-10-23 MED ORDER — PHENYLEPHRINE 40 MCG/ML (10ML) SYRINGE FOR IV PUSH (FOR BLOOD PRESSURE SUPPORT)
80.0000 ug | PREFILLED_SYRINGE | INTRAVENOUS | Status: DC | PRN
  Filled 2013-10-23: qty 10

## 2013-10-23 MED ORDER — EPHEDRINE 5 MG/ML INJ: 10.0000 mg | INTRAVENOUS | Status: DC | PRN

## 2013-10-23 MED ORDER — BUTORPHANOL TARTRATE 1 MG/ML IJ SOLN
2.0000 mg | INTRAMUSCULAR | Status: DC | PRN
  Administered 2013-10-23: 2 mg via INTRAMUSCULAR
  Filled 2013-10-23: qty 2

## 2013-10-23 MED ORDER — FENTANYL CITRATE 0.05 MG/ML IJ SOLN
100.0000 ug | INTRAMUSCULAR | Status: DC | PRN
  Administered 2013-10-23 (×2): 100 ug via INTRAVENOUS
  Filled 2013-10-23 (×3): qty 2

## 2013-10-23 MED ORDER — ACETAMINOPHEN 325 MG PO TABS: 650.0000 mg | ORAL_TABLET | ORAL | Status: DC | PRN

## 2013-10-23 MED ORDER — EPHEDRINE 5 MG/ML INJ
10.0000 mg | INTRAVENOUS | Status: DC | PRN
  Filled 2013-10-23: qty 4

## 2013-10-23 MED ORDER — ONDANSETRON HCL 4 MG/2ML IJ SOLN
4.0000 mg | Freq: Four times a day (QID) | INTRAMUSCULAR | Status: DC | PRN
  Administered 2013-10-23: 4 mg via INTRAVENOUS
  Filled 2013-10-23: qty 2

## 2013-10-23 MED ORDER — FENTANYL 2.5 MCG/ML BUPIVACAINE 1/10 % EPIDURAL INFUSION (WH - ANES)
14.0000 mL/h | INTRAMUSCULAR | Status: DC | PRN
  Administered 2013-10-23 – 2013-10-24 (×2): 14 mL/h via EPIDURAL
  Filled 2013-10-23 (×2): qty 125

## 2013-10-23 MED ORDER — LIDOCAINE HCL (PF) 1 % IJ SOLN
INTRAMUSCULAR | Status: DC | PRN
  Administered 2013-10-23 (×2): 5 mL

## 2013-10-23 MED ORDER — DIPHENHYDRAMINE HCL 50 MG/ML IJ SOLN: 12.5000 mg | INTRAMUSCULAR | Status: DC | PRN

## 2013-10-23 MED ORDER — IBUPROFEN 600 MG PO TABS: 600.0000 mg | ORAL_TABLET | Freq: Four times a day (QID) | ORAL | Status: DC | PRN

## 2013-10-23 MED ORDER — CALCIUM CARBONATE ANTACID 500 MG PO CHEW
400.0000 mg | CHEWABLE_TABLET | Freq: Once | ORAL | Status: AC
  Administered 2013-10-23: 400 mg via ORAL
  Filled 2013-10-23: qty 1

## 2013-10-23 MED ORDER — LACTATED RINGERS IV SOLN
INTRAVENOUS | Status: DC
  Administered 2013-10-23 (×2): via INTRAVENOUS

## 2013-10-23 NOTE — Progress Notes (Signed)
Shawna Michael is a 23 y.o. [id number] at 435w1d admitted for latent labor with severe pain.  Subjective: Patient is complaining of severe pain and would like something for pain  Objective: BP 122/71  Pulse 105  Temp(Src) 98.3 F (36.8 C) (Oral)  Resp 20  Ht 5\' 4"  (1.626 m)  Wt 77.565 kg (171 lb)  BMI 29.34 kg/m2  SpO2 97%  LMP 02/12/2013     FHT:  FHR: 125 bpm, variability: moderate,  accelerations:  Present,  decelerations:  Absent UC:   regular, every 2-3 minutes SVE:   Dilation: 3 Effacement (%): 50 Station: -2 Exam by:: Dr. Reola CalkinsBeck  Labs: Lab Results  Component Value Date   WBC 15.1* 10/23/2013   HGB 11.2* 10/23/2013   HCT 33.0* 10/23/2013   MCV 89.2 10/23/2013   PLT 192 10/23/2013    Assessment / Plan: Protracted latent phase Will recheck cervix in 2 hours and give morphine for sleep if not changing, if making progress will call for epidural  Labor: Progressing normally Preeclampsia:  no signs or symptoms of toxicity Fetal Wellbeing:  Category I Pain Control:  Fentanyl I/D:  n/a Anticipated MOD:  NSVD  [name], [name] 10/23/2013, 9:37 PM  I have seen and examined this patient and agree with above documentation in the resident's note.   Rulon AbideKeli Maressa Apollo, M.D. [facility] 10/23/2013 9:52 PM

## 2013-10-23 NOTE — Anesthesia Procedure Notes (Signed)
Epidural Patient location during procedure: OB Start time: 10/23/2013 11:16 PM  Staffing Anesthesiologist: Brayton CavesJACKSON, Monick Rena Performed by: anesthesiologist   Preanesthetic Checklist Completed: patient identified, site marked, surgical consent, pre-op evaluation, timeout performed, IV checked, risks and benefits discussed and monitors and equipment checked  Epidural Patient position: sitting Prep: site prepped and draped and DuraPrep Patient monitoring: continuous pulse ox and blood pressure Approach: midline Injection technique: LOR air  Needle:  Needle type: Tuohy  Needle gauge: 17 G Needle length: 9 cm and 9 Needle insertion depth: 5 cm cm Catheter type: closed end flexible Catheter size: 19 Gauge Catheter at skin depth: 10 cm Test dose: negative  Assessment Events: blood not aspirated, injection not painful, no injection resistance, negative IV test and no paresthesia  Additional Notes Patient identified.  Risk benefits discussed including failed block, incomplete pain control, headache, nerve damage, paralysis, blood pressure changes, nausea, vomiting, reactions to medication both toxic or allergic, and postpartum back pain.  Patient expressed understanding and wished to proceed.  All questions were answered.  Sterile technique used throughout procedure and epidural site dressed with sterile barrier dressing. No paresthesia or other complications noted.The patient did not experience any signs of intravascular injection such as tinnitus or metallic taste in mouth nor signs of intrathecal spread such as rapid motor block. Please see nursing notes for vital signs.

## 2013-10-23 NOTE — Anesthesia Preprocedure Evaluation (Signed)
Anesthesia Evaluation  Patient identified by MRN, date of birth, ID band Patient awake    Reviewed: Allergy & Precautions, H&P , Patient's Chart, lab work & pertinent test results  History of Anesthesia Complications (+) POST - OP SPINAL HEADACHE and history of anesthetic complications  Airway Mallampati: II TM Distance: >3 FB Neck ROM: full    Dental   Pulmonary former smoker,  breath sounds clear to auscultation        Cardiovascular Rhythm:regular Rate:Normal     Neuro/Psych    GI/Hepatic   Endo/Other    Renal/GU      Musculoskeletal   Abdominal   Peds  Hematology   Anesthesia Other Findings   Reproductive/Obstetrics (+) Pregnancy                           Anesthesia Physical Anesthesia Plan  ASA: II  Anesthesia Plan: Epidural   Post-op Pain Management:    Induction:   Airway Management Planned:   Additional Equipment:   Intra-op Plan:   Post-operative Plan:   Informed Consent: I have reviewed the patients History and Physical, chart, labs and discussed the procedure including the risks, benefits and alternatives for the proposed anesthesia with the patient or authorized representative who has indicated his/her understanding and acceptance.     Plan Discussed with:   Anesthesia Plan Comments:         Anesthesia Quick Evaluation

## 2013-10-23 NOTE — H&P (Signed)
Shawna Michael is a 23 y.o. female [phone] with IUP at 6486w1d presenting for painful contractions that started this afternoon. Pt reports good fetal movement, no LOF, no VB. Pt otherwise uncomplicated prenatal coures.   Prenatal History/Complications: Uncomplicated course  Past Medical History: Past Medical History  Diagnosis Date  . Complication of anesthesia   . Spinal headache     Past Surgical History: Past Surgical History  Procedure Laterality Date  . Mandible fracture surgery  2003    Obstetrical History: OB History   Grav Para Term Preterm Abortions TAB SAB Ect Mult Living   6 2  2 3  3   1       Gynecological History: OB History   Grav Para Term Preterm Abortions TAB SAB Ect Mult Living   6 2  2 3  3   1       Social History: History   Social History  . Marital Status: Single    Spouse Name: N/A    Number of Children: N/A  . Years of Education: N/A   Social History Main Topics  . Smoking status: Former Smoker -- 0.25 packs/day    Quit date: 07/20/2013  . Smokeless tobacco: Never Used     Comment: 05/11/13 is cutting back on smoking  . Alcohol Use: No  . Drug Use: Yes    Special: Marijuana     Comment: twice a month  . Sexual Activity: Yes    Birth Control/ Protection: None   Other Topics Concern  . Not on file   Social History Narrative  . No narrative on file    Family History: Family History  Problem Relation Age of Onset  . Adopted: Yes    Allergies: Allergies  Allergen Reactions  . Valium [Diazepam] Other (See Comments)    Muscles "locked up"    Facility-administered medications prior to admission  Medication Dose Route Frequency Provider Last Rate Last Dose  . Tdap (BOOSTRIX) injection 0.5 mL  0.5 mL Intramuscular Once Minta BalsamMichael R Shantara Goosby, MD       Prescriptions prior to admission  Medication Sig Dispense Refill  . acetaminophen (TYLENOL) 500 MG tablet Take 1,000 mg by mouth every 6 (six) hours as needed for pain.      . Prenatal  Vit-Fe Fumarate-FA (PRENATAL MULTIVITAMIN) TABS tablet Take 1 tablet by mouth daily at 12 noon.         Review of Systems   Constitutional: Negative for fever, chills, weight loss, malaise/fatigue and diaphoresis.  HENT: Negative for hearing loss, ear pain, nosebleeds, congestion, sore throat, neck pain, tinnitus and ear discharge.   Eyes: Negative for blurred vision, double vision, photophobia, pain, discharge and redness.  Respiratory: Negative for cough, hemoptysis, sputum production, shortness of breath, wheezing and stridor.   Cardiovascular: Negative for chest pain, palpitations, orthopnea,  leg swelling  Gastrointestinal: Positive for abdominal pain. Negative for heartburn, nausea, vomiting, diarrhea, constipation, blood in stool Genitourinary: Negative for dysuria, urgency, frequency, hematuria and flank pain.  Musculoskeletal: Negative for myalgias, back pain, joint pain and falls.  Skin: Negative for itching and rash.  Neurological: Negative for dizziness, tingling, tremors, sensory change, speech change, focal weakness, seizures, loss of consciousness, weakness and headaches.  Endo/Heme/Allergies: Negative for environmental allergies and polydipsia. Does not bruise/bleed easily.  Psychiatric/Behavioral: Negative for depression, suicidal ideas, hallucinations, memory loss and substance abuse. The patient is not nervous/anxious and does not have insomnia.       Blood pressure 117/81, pulse 114, temperature 97.9 F (  36.6 C), temperature source Oral, resp. rate 20, height 5\' 4"  (1.626 m), weight 77.565 kg (171 lb), last menstrual period 02/12/2013, SpO2 97.00%. General appearance: alert, cooperative, appears stated age and no distress Lungs: clear to auscultation bilaterally Heart: regular rate and rhythm Abdomen: soft, non-tender; bowel sounds normal Extremities: Homans sign is negative, no sign of DVT Presentation: cephalic Fetal monitoringBaseline: 130s bpm, Variability: Good  {> 6 bpm), Accelerations: Reactive and Decelerations: 2 decels to 90 that recovered without intervention Uterine activity q3-71min   Dilation: 2 Effacement (%): 50 Station: -3 Exam by:: Dr. Ike Bene   Prenatal labs: ABO, Rh: O/POS/-- (08/06 1115) Antibody: NEG (08/06 1115) Rubella:   RPR: NON REAC (10/29 1335)  HBsAg: NEGATIVE (08/06 1115)  HIV: NON REACTIVE (10/29 1335)  GBS: Negative (01/10 0000)   Clinic  Low Risk  Dating  Ultrasound:   16 weeks        Ultrasound consistent with LMP: No  Genetic Screen 1 Screen:    73             AFP:                    Quad:      normal            NIPS:  Anatomic [place] Normal  GTT Early:               Third trimester:   TDaP vaccine  10/12/2013  Flu vaccine  decline  GBS  NEg  Baby Food  Breast  Contraception  mirena or paraguard  Circumcision  desires as outpatient  Pediatrician       No results found for this or any previous visit (from the past 24 hour(s)).  Assessment: Shawna Michael is a 23 y.o. [phone] with an IUP at [redacted]w[redacted]d presenting for painful contractions. No cervical change. In latent labor. Will give morphine sleep and recheck.  Shawna Michael is a 23 y.o. [phone] at [redacted]w[redacted]d #Labor: monitor in labor and recheck in 4hrs or in am #Pain: Will offer morphine sleep if infant becomes reactive #FWB:  Cat II #ID:  GBS neg #MOF: Breast #MOC: IUD  Shenise Wolgamott RYAN 10/23/2013, 7:54 PM

## 2013-10-24 ENCOUNTER — Encounter (HOSPITAL_COMMUNITY): Payer: Self-pay | Admitting: *Deleted

## 2013-10-24 DIAGNOSIS — O864 Pyrexia of unknown origin following delivery: Secondary | ICD-10-CM

## 2013-10-24 DIAGNOSIS — F1210 Cannabis abuse, uncomplicated: Secondary | ICD-10-CM

## 2013-10-24 DIAGNOSIS — O99344 Other mental disorders complicating childbirth: Secondary | ICD-10-CM

## 2013-10-24 LAB — RPR: RPR Ser Ql: NONREACTIVE

## 2013-10-24 MED ORDER — BENZOCAINE-MENTHOL 20-0.5 % EX AERO
1.0000 "application " | INHALATION_SPRAY | CUTANEOUS | Status: DC | PRN
  Administered 2013-10-24: 1 via TOPICAL
  Filled 2013-10-24 (×2): qty 56

## 2013-10-24 MED ORDER — DIBUCAINE 1 % RE OINT
1.0000 "application " | TOPICAL_OINTMENT | RECTAL | Status: DC | PRN
  Filled 2013-10-24: qty 28

## 2013-10-24 MED ORDER — WITCH HAZEL-GLYCERIN EX PADS: 1.0000 "application " | MEDICATED_PAD | CUTANEOUS | Status: DC | PRN

## 2013-10-24 MED ORDER — OXYCODONE-ACETAMINOPHEN 5-325 MG PO TABS
1.0000 | ORAL_TABLET | ORAL | Status: DC | PRN
  Administered 2013-10-24: 1 via ORAL
  Filled 2013-10-24: qty 1

## 2013-10-24 MED ORDER — ONDANSETRON HCL 4 MG/2ML IJ SOLN: 4.0000 mg | INTRAMUSCULAR | Status: DC | PRN

## 2013-10-24 MED ORDER — DIPHENHYDRAMINE HCL 25 MG PO CAPS: 25.0000 mg | ORAL_CAPSULE | Freq: Four times a day (QID) | ORAL | Status: DC | PRN

## 2013-10-24 MED ORDER — LANOLIN HYDROUS EX OINT: TOPICAL_OINTMENT | CUTANEOUS | Status: DC | PRN

## 2013-10-24 MED ORDER — TETANUS-DIPHTH-ACELL PERTUSSIS 5-2.5-18.5 LF-MCG/0.5 IM SUSP: 0.5000 mL | Freq: Once | INTRAMUSCULAR | Status: DC

## 2013-10-24 MED ORDER — SENNOSIDES-DOCUSATE SODIUM 8.6-50 MG PO TABS
2.0000 | ORAL_TABLET | ORAL | Status: DC
  Administered 2013-10-24: 2 via ORAL
  Filled 2013-10-24 (×2): qty 1

## 2013-10-24 MED ORDER — IBUPROFEN 600 MG PO TABS
600.0000 mg | ORAL_TABLET | Freq: Four times a day (QID) | ORAL | Status: DC
  Administered 2013-10-24 – 2013-10-25 (×5): 600 mg via ORAL
  Filled 2013-10-24 (×5): qty 1

## 2013-10-24 MED ORDER — ACETAMINOPHEN 500 MG PO TABS
1000.0000 mg | ORAL_TABLET | Freq: Once | ORAL | Status: AC
  Administered 2013-10-24: 1000 mg via ORAL

## 2013-10-24 MED ORDER — ONDANSETRON HCL 4 MG PO TABS: 4.0000 mg | ORAL_TABLET | ORAL | Status: DC | PRN

## 2013-10-24 MED ORDER — ZOLPIDEM TARTRATE 5 MG PO TABS: 5.0000 mg | ORAL_TABLET | Freq: Every evening | ORAL | Status: DC | PRN

## 2013-10-24 MED ORDER — PRENATAL MULTIVITAMIN CH: 1.0000 | ORAL_TABLET | Freq: Every day | ORAL | Status: DC

## 2013-10-24 MED ORDER — SIMETHICONE 80 MG PO CHEW: 80.0000 mg | CHEWABLE_TABLET | ORAL | Status: DC | PRN
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
## 2013-10-24 NOTE — Progress Notes (Signed)
Shawna Michael is a 23 y.o. [phone] at 674w2d admitted for active labor  Subjective: Pt feeling more and more pressure. +FM  Objective: BP 120/76  Pulse 107  Temp(Src) 98.8 F (37.1 C) (Oral)  Resp 20  Ht 5\' 4"  (1.626 m)  Wt 77.565 kg (171 lb)  BMI 29.34 kg/m2  SpO2 95%  LMP 02/12/2013      FHT:  FHR: 125 bpm, variability: moderate,  accelerations:  Present,  decelerations:  Absent UC:   regular, every 2-3 minutes SVE:   Dilation: 8.5 Effacement (%): 100 Station: 0 Exam by:: Reola CalkinsBeck, MD  Labs: Lab Results  Component Value Date   WBC 15.1* 10/23/2013   HGB 11.2* 10/23/2013   HCT 33.0* 10/23/2013   MCV 89.2 10/23/2013   PLT 192 10/23/2013    Assessment / Plan: Spontaneous labor, progressing normally  Labor: AROM performed with return of clear fluid Fetal Wellbeing:  Category I Pain Control:  Epidural I/D:  n/a Anticipated MOD:  NSVD  Shawna Michael 10/24/2013, 4:02 AM

## 2013-10-24 NOTE — Progress Notes (Signed)
Shawna Michael is a 23 y.o. [id number] at 868w2d admitted for active labor  Subjective:   Objective: BP 93/44  Pulse 90  Temp(Src) 98.8 F (37.1 C) (Oral)  Resp 20  Ht 5\' 4"  (1.626 m)  Wt 77.565 kg (171 lb)  BMI 29.34 kg/m2  SpO2 95%  LMP 02/12/2013      FHT:  FHR: 130 bpm, variability: moderate,  accelerations:  Present,  decelerations:  Present occasional variables UC:   irregular, every 2-5 minutes SVE:   Dilation: 6 Effacement (%): 80 Station: -1 Exam by:: Dr. Richarda BladeAdamo  Labs: Lab Results  Component Value Date   WBC 15.1* 10/23/2013   HGB 11.2* 10/23/2013   HCT 33.0* 10/23/2013   MCV 89.2 10/23/2013   PLT 192 10/23/2013    Assessment / Plan: Spontaneous labor, progressing normally  Labor: Progressing normally Preeclampsia:  no signs or symptoms of toxicity Fetal Wellbeing:  Category II Pain Control:  Epidural I/D:  n/a Anticipated MOD:  NSVD  [name], [name] 10/24/2013, 2:30 AM  I have seen and examined this patient and agree with above documentation in the resident's note.   Rulon AbideKeli Mattisyn Cardona, M.D. [facility] 10/24/2013 2:49 AM

## 2013-10-24 NOTE — Anesthesia Postprocedure Evaluation (Signed)
  Anesthesia Post-op Note  Anesthesia Post Note  Patient: Shawna Michael  Procedure(s) Performed: * No procedures listed *  Anesthesia type: Epidural  Patient location: Mother/Baby  Post pain: Pain level controlled  Post assessment: Post-op Vital signs reviewed  Last Vitals:  Filed Vitals:   10/24/13 1340  BP: 107/70  Pulse: 93  Temp: 36.9 C  Resp: 18    Post vital signs: Reviewed  Level of consciousness:alert  Complications: No apparent anesthesia complications

## 2013-10-25 LAB — CBC
HEMATOCRIT: 29.7 % — AB (ref 36.0–46.0)
HEMOGLOBIN: 9.9 g/dL — AB (ref 12.0–15.0)
MCH: 30.3 pg (ref 26.0–34.0)
MCHC: 33.3 g/dL (ref 30.0–36.0)
MCV: 90.8 fL (ref 78.0–100.0)
Platelets: 165 10*3/uL (ref 150–400)
RBC: 3.27 MIL/uL — AB (ref 3.87–5.11)
RDW: 14.2 % (ref 11.5–15.5)
WBC: 13.9 10*3/uL — ABNORMAL HIGH (ref 4.0–10.5)

## 2013-10-25 MED ORDER — IBUPROFEN 600 MG PO TABS: 600.0000 mg | ORAL_TABLET | Freq: Four times a day (QID) | ORAL | Status: DC

## 2013-10-25 NOTE — Discharge Summary (Signed)
Attestation of Attending Supervision of Fellow: Evaluation and management procedures were performed by the Fellow under my supervision and collaboration.  I have reviewed the Fellow's note and chart, and I agree with the management and plan.    

## 2013-10-25 NOTE — Progress Notes (Signed)
UR chart review completed.  
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
## 2013-10-25 NOTE — Discharge Instructions (Signed)

## 2013-10-26 ENCOUNTER — Other Ambulatory Visit: Payer: Medicaid Other

## 2013-11-21 NOTE — Telephone Encounter (Signed)
Close encouner

## 2013-11-25 ENCOUNTER — Encounter: Payer: Self-pay | Admitting: Family Medicine

## 2013-11-25 ENCOUNTER — Ambulatory Visit (INDEPENDENT_AMBULATORY_CARE_PROVIDER_SITE_OTHER): Payer: Medicaid Other | Admitting: Family Medicine

## 2013-11-25 VITALS — BP 115/79 | HR 64 | Temp 97.8°F | Ht 64.0 in | Wt 155.7 lb

## 2013-11-25 DIAGNOSIS — IMO0001 Reserved for inherently not codable concepts without codable children: Secondary | ICD-10-CM

## 2013-11-25 DIAGNOSIS — Z01812 Encounter for preprocedural laboratory examination: Secondary | ICD-10-CM

## 2013-11-25 DIAGNOSIS — Z30430 Encounter for insertion of intrauterine contraceptive device: Secondary | ICD-10-CM

## 2013-11-25 LAB — POCT PREGNANCY, URINE: Preg Test, Ur: NEGATIVE

## 2013-11-25 MED ORDER — PARAGARD INTRAUTERINE COPPER IU IUD
1.0000 [IU] | INTRAUTERINE_SYSTEM | Freq: Once | INTRAUTERINE | Status: AC
  Administered 2013-11-25: 1 [IU] via INTRAUTERINE

## 2013-11-25 NOTE — Patient Instructions (Signed)
Offices that do circumcisions: [facility] [phone] [facility]() $244 within 4 weeks of birth, [facility] [phone] [facility]([employer]) $250 within 7 days of birth, [facility] [phone] [facility]([place]) $175 within 2 weeks of birth

## 2013-11-25 NOTE — Progress Notes (Addendum)
Patient ID: Shawna Michael, female   DOB: 21-Aug-1991, 23 y.o.   MRN: [id number] Subjective:     Shawna Michael is a 23 y.o. female who presents for a postpartum visit. She is 4 weeks postpartum following a spontaneous vaginal delivery. I have fully reviewed the prenatal and intrapartum course. The delivery was at 40+2 gestational weeks. Outcome: spontaneous vaginal delivery. Anesthesia: epidural. Postpartum course has been feeling depressed/anxious. Baby's course has been uncomplicated. Baby is feeding by breast. Bleeding thin lochia. Bowel function is normal. Bladder function is normal. Patient is not sexually active. Contraception method is IUD. Postpartum depression screening: negative but anxious and desires medications.  The following portions of the patient's history were reviewed and updated as appropriate: allergies, current medications, past family history, past medical history, past social history, past surgical history and problem list.     Review of Systems Pertinent items are noted in HPI.   Objective:    BP 115/79  Pulse 64  Temp(Src) 97.8 F (36.6 C) (Oral)  Ht 5\' 4"  (1.626 m)  Wt 70.625 kg (155 lb 11.2 oz)  BMI 26.71 kg/m2  Breastfeeding? Yes  General:  alert, cooperative, appears stated age and no distress   Breasts:  No complaints, no exam today. Cont breast feeding  Lungs: clear to auscultation bilaterally and normal percussion bilaterally  Heart:  regular rate and rhythm, S1, S2 normal, no murmur, click, rub or gallop and normal apical impulse  Abdomen: soft, non-tender; bowel sounds normal; no masses,  no organomegaly   Vulva:  normal  Vagina: normal vagina  Cervix:  no cervical motion tenderness and no lesions  Corpus: not examined  Adnexa:  not evaluated  Rectal Exam: Normal rectovaginal exam        Assessment:     4wk PP postpartum exam. Pap smearnot done at today's visit.   Plan:    1. Contraception: IUD 2. PPD/anxiety - zoloft 50mg  qday starting.  Continue for 6months. 3. Follow up in: 4wk as needed.  4. F/u in 4 for pap smear and string check and f/u on anxiety/depression.    Shawna Michael is a 23 y.o. year old Caucasian female [phone]  who presents for placement of a paragarud IUD.   The risks and benefits of the method and placement have been thouroughly reviewed with the patient and all questions were answered.  Specifically the patient is aware of failure rate of 10/998, expulsion of the IUD and of possible perforation.  The patient is aware of irregular bleeding due to the method and understands the incidence of irregular bleeding diminishes with time.  Time out was performed.  A Graves speculum was placed.  The cervix was prepped using Betadine. The cervix was grasped with a tenaculum and the uterus was sounded to 8 cm. The IUD was inserted to 8 cm.  It was pulled back 1 cm and the IUD was disengaged.  The strings were trimmed to 3 cm.  The patient was instructed on signs and symptoms of infection and to check for the strings after each menses or each month.  The patient is to refrain from intercourse for 3 days.

## 2013-11-28 ENCOUNTER — Telehealth: Payer: Self-pay | Admitting: General Practice

## 2013-11-28 DIAGNOSIS — F419 Anxiety disorder, unspecified: Secondary | ICD-10-CM

## 2013-11-28 NOTE — Telephone Encounter (Signed)
Patient called and left message stating she was seen here last Monday and saw Dr Ike Benedom and was given a Rx for low dose zoloft but the Rx never went to her pharmacy. She uses [facility] on [address] and would like this sent back in please. Called patient back stating I was returning her phone call and that Dr Ike Benedom was not here in the hospital today but would be here tomorrow and that a nurse will have to verify this with him and then we will call her back letting her know about the medication. Patient verbalized understanding and had no further questions.

## 2013-11-30 MED ORDER — SERTRALINE HCL 50 MG PO TABS: 50.0000 mg | ORAL_TABLET | Freq: Every day | ORAL | Status: DC

## 2013-11-30 NOTE — Telephone Encounter (Signed)
Received order from Dr Ike Benedom to prescribe Zoloft 50 once a day disp 90 with one refill. Called patient and informed of medication available for pickup. Patient verbalized understanding with no further questions

## 2013-12-26 ENCOUNTER — Ambulatory Visit (INDEPENDENT_AMBULATORY_CARE_PROVIDER_SITE_OTHER): Payer: Medicaid Other | Admitting: Family Medicine

## 2013-12-26 ENCOUNTER — Encounter: Payer: Self-pay | Admitting: Family Medicine

## 2013-12-26 ENCOUNTER — Encounter: Payer: Self-pay | Admitting: *Deleted

## 2013-12-26 ENCOUNTER — Other Ambulatory Visit (HOSPITAL_COMMUNITY)
Admission: RE | Admit: 2013-12-26 | Discharge: 2013-12-26 | Disposition: A | Discharge status: Home / Self Care | Payer: Medicaid Other | Source: Ambulatory Visit | Attending: Family Medicine | Admitting: Family Medicine

## 2013-12-26 VITALS — BP 127/72 | HR 84 | Temp 96.8°F | Ht 64.0 in | Wt 156.4 lb

## 2013-12-26 DIAGNOSIS — Z1151 Encounter for screening for human papillomavirus (HPV): Secondary | ICD-10-CM | POA: Insufficient documentation

## 2013-12-26 DIAGNOSIS — Z124 Encounter for screening for malignant neoplasm of cervix: Secondary | ICD-10-CM

## 2013-12-26 DIAGNOSIS — R87810 Cervical high risk human papillomavirus (HPV) DNA test positive: Secondary | ICD-10-CM | POA: Insufficient documentation

## 2013-12-26 DIAGNOSIS — B369 Superficial mycosis, unspecified: Secondary | ICD-10-CM

## 2013-12-26 MED ORDER — FLUOXETINE HCL 20 MG PO CAPS: 20.0000 mg | ORAL_CAPSULE | Freq: Every day | ORAL | Status: DC

## 2013-12-26 MED ORDER — NYSTATIN 100000 UNIT/GM EX POWD: 1.0000 g | Freq: Two times a day (BID) | CUTANEOUS | Status: DC

## 2013-12-26 NOTE — Progress Notes (Signed)
Pap smear String check Skin exam zoloft to prozac, no SI/HI not feeling depressed cont for 6 months.  Subjective:     Patient ID: Shawna Michael, female   DOB: May 12, 1991, 23 y.o.   MRN: [id number]  HPI Shawna Michael is a 23 y.o. [id number] s/p delivery doing well. Complicated with Postpartum depression. Also here for pap. No SI/HI. Feels like she is doing well, but have diarrhea and GI with medication. Would like to trial different SSRI  Complains of skin rash on chest, back, hands. Present for years. Hasnt been seen by primary care since [place].  Review of Systems Complains of skin rash, pruritic, erythematous. No other complaints at this time.     Objective:   Physical Exam Filed Vitals:   12/26/13 1413  BP: 127/72  Pulse: 84  Temp: 96.8 F (36 C)   VSS NAD Skin: erythematous plaque with satalite appearing lesions from primary black on chest, back arms.  Non gravid, NTTP, nd SSE: white string seen.      Assessment:     Shawna Michael is a 23 y.o. [id number] s/p vaginal delivery with paragaurd. Tolerating well.   Depression symptoms resolved but will swap to prozac. No longer breast feeding String visualized Fungal appearing rash: will tx with nystatic cream. With time course of rash, would recommend bx. Unable to be seen by dermatologist 2/2 insurance. [facility]   Shawna ScaleMichael Ryan Yazlynn Birkeland, MD OB Fellow

## 2013-12-26 NOTE — Patient Instructions (Signed)

## 2013-12-31 ENCOUNTER — Encounter: Payer: Self-pay | Admitting: Family Medicine

## 2013-12-31 DIAGNOSIS — IMO0002 Reserved for concepts with insufficient information to code with codable children: Secondary | ICD-10-CM | POA: Insufficient documentation

## 2014-01-02 ENCOUNTER — Telehealth: Payer: Self-pay

## 2014-01-02 NOTE — Telephone Encounter (Signed)
Called pt. And informed her of pap results. 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 Informed her that based on her age and the most current guidelines all this means is that we will watch more closely, pap with HPV testing in a year versus 3 years. Advised pt. To call next January 2016 to schedule annual exam for March 2016 to repeat pap. Pt. Verbalized understanding and gratitude and had no further questions or concerns.

## 2014-01-02 NOTE — Telephone Encounter (Signed)
Message copied by Louanna RawAMPBELL, Lorayne Getchell M on Mon Jan 02, 2014  5:03 PM ------      Message from: Minta BalsamDOM, MICHAEL R      Created: Sat Dec 31, 2013 11:42 AM       Pt is ASCUS/HPV+ - please call and inform patient. She needs repeat COTESTING in 12 months. Thanks ------

## 2014-01-10 ENCOUNTER — Inpatient Hospital Stay (HOSPITAL_COMMUNITY)
Admission: AD | Admit: 2014-01-10 | Discharge: 2014-01-10 | Disposition: A | Discharge status: Home / Self Care | Payer: Medicaid Other | Source: Ambulatory Visit | Attending: Obstetrics & Gynecology | Admitting: Obstetrics & Gynecology

## 2014-01-10 DIAGNOSIS — Z30431 Encounter for routine checking of intrauterine contraceptive device: Secondary | ICD-10-CM | POA: Insufficient documentation

## 2014-01-10 DIAGNOSIS — R109 Unspecified abdominal pain: Secondary | ICD-10-CM | POA: Insufficient documentation

## 2014-01-10 LAB — URINALYSIS, ROUTINE W REFLEX MICROSCOPIC
BILIRUBIN URINE: NEGATIVE
Glucose, UA: NEGATIVE mg/dL
Ketones, ur: NEGATIVE mg/dL
Leukocytes, UA: NEGATIVE
Nitrite: NEGATIVE
PROTEIN: NEGATIVE mg/dL
Specific Gravity, Urine: 1.015 (ref 1.005–1.030)
Urobilinogen, UA: 0.2 mg/dL (ref 0.0–1.0)
pH: 6 (ref 5.0–8.0)

## 2014-01-10 LAB — URINE MICROSCOPIC-ADD ON

## 2014-01-10 NOTE — MAU Note (Signed)
Pt not in lobby at 2058, or 2115. Discharged AMA

## 2014-01-10 NOTE — MAU Note (Signed)
Paragaurd IUD placed 1 month ago. Finished period yesterday and has had sever abdominal pain since then, states feels like "muscles are ripping apart where the iud is". Denies vaginal bleeding or discharge.

## 2014-01-13 ENCOUNTER — Inpatient Hospital Stay (HOSPITAL_COMMUNITY)
Admission: AD | Admit: 2014-01-13 | Discharge: 2014-01-13 | Disposition: A | Discharge status: Home / Self Care | Payer: Medicaid Other | Source: Ambulatory Visit | Attending: Family Medicine | Admitting: Family Medicine

## 2014-01-13 ENCOUNTER — Encounter (HOSPITAL_COMMUNITY): Payer: Self-pay | Admitting: *Deleted

## 2014-01-13 ENCOUNTER — Inpatient Hospital Stay (HOSPITAL_COMMUNITY): Payer: Medicaid Other

## 2014-01-13 DIAGNOSIS — N72 Inflammatory disease of cervix uteri: Secondary | ICD-10-CM

## 2014-01-13 DIAGNOSIS — N949 Unspecified condition associated with female genital organs and menstrual cycle: Secondary | ICD-10-CM | POA: Insufficient documentation

## 2014-01-13 DIAGNOSIS — Z30431 Encounter for routine checking of intrauterine contraceptive device: Secondary | ICD-10-CM | POA: Insufficient documentation

## 2014-01-13 DIAGNOSIS — R109 Unspecified abdominal pain: Secondary | ICD-10-CM | POA: Insufficient documentation

## 2014-01-13 DIAGNOSIS — Z87891 Personal history of nicotine dependence: Secondary | ICD-10-CM | POA: Insufficient documentation

## 2014-01-13 LAB — URINALYSIS, ROUTINE W REFLEX MICROSCOPIC
Bilirubin Urine: NEGATIVE
Glucose, UA: NEGATIVE mg/dL
Hgb urine dipstick: NEGATIVE
Ketones, ur: NEGATIVE mg/dL
Nitrite: NEGATIVE
PH: 6 (ref 5.0–8.0)
Protein, ur: NEGATIVE mg/dL
Specific Gravity, Urine: 1.02 (ref 1.005–1.030)
UROBILINOGEN UA: 0.2 mg/dL (ref 0.0–1.0)

## 2014-01-13 LAB — WET PREP, GENITAL
Clue Cells Wet Prep HPF POC: NONE SEEN
TRICH WET PREP: NONE SEEN
YEAST WET PREP: NONE SEEN

## 2014-01-13 LAB — URINE MICROSCOPIC-ADD ON

## 2014-01-13 LAB — POCT PREGNANCY, URINE: Preg Test, Ur: NEGATIVE

## 2014-01-13 MED ORDER — OXYCODONE-ACETAMINOPHEN 5-325 MG PO TABS
1.0000 | ORAL_TABLET | Freq: Once | ORAL | Status: AC
  Administered 2014-01-13: 1 via ORAL
  Filled 2014-01-13: qty 1

## 2014-01-13 MED ORDER — AZITHROMYCIN 250 MG PO TABS: 1000.0000 mg | ORAL_TABLET | Freq: Every day | ORAL | Status: DC

## 2014-01-13 MED ORDER — OXYCODONE-ACETAMINOPHEN 5-325 MG PO TABS: 1.0000 | ORAL_TABLET | ORAL | Status: DC | PRN

## 2014-01-13 MED ORDER — CEFTRIAXONE SODIUM 250 MG IJ SOLR: 250.0000 mg | INTRAMUSCULAR | Status: DC

## 2014-01-13 MED ORDER — AZITHROMYCIN 250 MG PO TABS
1000.0000 mg | ORAL_TABLET | Freq: Once | ORAL | Status: AC
  Administered 2014-01-13: 1000 mg via ORAL
  Filled 2014-01-13: qty 4

## 2014-01-13 MED ORDER — CEFTRIAXONE SODIUM 250 MG IJ SOLR
250.0000 mg | Freq: Once | INTRAMUSCULAR | Status: AC
  Administered 2014-01-13: 250 mg via INTRAMUSCULAR
  Filled 2014-01-13: qty 250

## 2014-01-13 NOTE — Discharge Instructions (Signed)
Cervicitis Cervicitis is a soreness and puffiness (inflammation) of the cervix.  HOME CARE  Do not have sex (intercourse) until your doctor says it is okay.  Take your antibiotic medicine as told. Finish it even if you start to feel better. GET HELP IF:   Yours symptoms that brought you to the doctor come back.  You have a fever. MAKE SURE YOU:   Understand these instructions.  Will watch your condition.  Will get help right away if you are not doing well or get worse. Document Released: 07/01/2008 Document Revised: 05/25/2013 Document Reviewed: 03/16/2013 [facility] Patient Information 2014 [employer] [place].

## 2014-01-13 NOTE — MAU Note (Signed)
Patient states she had a paraguard IUD placed in February after giving birth in January. States she has been having abdominal pain for about 4-5 days with some nausea. Denies bleeding or discharge.

## 2014-01-13 NOTE — MAU Provider Note (Signed)
History     CSN: [id number]  Arrival date and time: 01/13/14 1191   First Provider Initiated Contact with Patient 01/13/14 1038      Chief Complaint  Patient presents with  . Abdominal Pain   HPI [name] is a 23 y.o. [phone] who presents to MAU today with complaint of abdominal pain x 4-5 days. She states a normal SVD on 10/24/13. She had paragard IUD placed without complications in [facility] 11/25/13. She also endorses occasional nausea. She has not had vomiting, diarrhea or constipation. She denies UTI symptoms, fever, vaginal bleeding or discharge. She states that the pain is primarily in the lower abdomen. She states it is dull, constant pain with occasional sharp worsening pains. She rates her pain at 6/10 now.   OB History   Grav Para Term Preterm Abortions TAB SAB Ect Mult Living   6 3 1 2 3  3   2       Past Medical History  Diagnosis Date  . Complication of anesthesia   . Spinal headache     Past Surgical History  Procedure Laterality Date  . Mandible fracture surgery  2003    Family History  Problem Relation Age of Onset  . Adopted: Yes    History  Substance Use Topics  . Smoking status: Former Smoker -- 0.25 packs/day    Quit date: 07/20/2013  . Smokeless tobacco: Never Used     Comment: 05/11/13 is cutting back on smoking  . Alcohol Use: Yes     Comment: occasional    Allergies:  Allergies  Allergen Reactions  . Valium [Diazepam] Other (See Comments)    Muscles "locked up"    Facility-administered medications prior to admission  Medication Dose Route Frequency Provider Last Rate Last Dose  . Tdap (BOOSTRIX) injection 0.5 mL  0.5 mL Intramuscular Once Minta Balsam, MD       Prescriptions prior to admission  Medication Sig Dispense Refill  . acetaminophen (TYLENOL) 500 MG tablet Take 1,000 mg by mouth every 6 (six) hours as needed for pain.      . Aspirin-Acetaminophen-Caffeine (EXCEDRIN PO) Take 2 tablets by mouth daily as needed. Rotates  with tylenol      . FLUoxetine (PROZAC) 20 MG capsule Take 1 capsule (20 mg total) by mouth daily.  90 capsule  3  . nystatin (MYCOSTATIN/NYSTOP) 100000 UNIT/GM POWD Apply 1 g topically 2 (two) times daily.  60 g  0    Review of Systems  Constitutional: Negative for fever and malaise/fatigue.  Gastrointestinal: Positive for abdominal pain. Negative for nausea, vomiting, diarrhea and constipation.  Genitourinary: Negative for dysuria, urgency and frequency.       Neg - vaginal bleeding, discharge   Physical Exam   Blood pressure 103/61, pulse 63, temperature 98.3 F (36.8 C), temperature source Oral, resp. rate 16, height 5\' 4"  (1.626 m), weight 71.487 kg (157 lb 9.6 oz), last menstrual period 12/31/2013, SpO2 100.00%, not currently breastfeeding.  Physical Exam  Constitutional: She is oriented to person, place, and time. She appears well-developed and well-nourished. No distress.  HENT:  Head: Normocephalic and atraumatic.  Cardiovascular: Normal rate.   Respiratory: Effort normal.  Genitourinary: Uterus is tender. Uterus is not enlarged. Cervix exhibits motion tenderness. Cervix exhibits no discharge and no friability. Right adnexum displays tenderness. Right adnexum displays no mass. Left adnexum displays tenderness. Left adnexum displays no mass. No bleeding around the vagina. Vaginal discharge (scant thin, white discharge noted) found.  Patient  did not tolerate exam well, unable to visualize IUD strings  Neurological: She is alert and oriented to person, place, and time.  Skin: Skin is warm and dry. No erythema.  Psychiatric: She has a normal mood and affect.   Results for orders placed during the hospital encounter of 01/13/14 (from the past 24 hour(s))  POCT PREGNANCY, URINE     Status: None   Collection Time    01/13/14 10:38 AM      Result Value Ref Range   Preg Test, Ur NEGATIVE  NEGATIVE  URINALYSIS, ROUTINE W REFLEX MICROSCOPIC     Status: Abnormal   Collection Time     01/13/14 10:39 AM      Result Value Ref Range   Color, Urine YELLOW  YELLOW   APPearance HAZY (*) CLEAR   Specific Gravity, Urine 1.020  1.005 - 1.030   pH 6.0  5.0 - 8.0   Glucose, UA NEGATIVE  NEGATIVE mg/dL   Hgb urine dipstick NEGATIVE  NEGATIVE   Bilirubin Urine NEGATIVE  NEGATIVE   Ketones, ur NEGATIVE  NEGATIVE mg/dL   Protein, ur NEGATIVE  NEGATIVE mg/dL   Urobilinogen, UA 0.2  0.0 - 1.0 mg/dL   Nitrite NEGATIVE  NEGATIVE   Leukocytes, UA SMALL (*) NEGATIVE  URINE MICROSCOPIC-ADD ON     Status: Abnormal   Collection Time    01/13/14 10:39 AM      Result Value Ref Range   Squamous Epithelial / LPF FEW (*) RARE   WBC, UA 3-6  <3 WBC/hpf   RBC / HPF 0-2  <3 RBC/hpf   Bacteria, UA FEW (*) RARE   Urine-Other MUCOUS PRESENT    WET PREP, GENITAL     Status: Abnormal   Collection Time    01/13/14 11:24 AM      Result Value Ref Range   Yeast Wet Prep HPF POC NONE SEEN  NONE SEEN   Trich, Wet Prep NONE SEEN  NONE SEEN   Clue Cells Wet Prep HPF POC NONE SEEN  NONE SEEN   WBC, Wet Prep HPF POC MODERATE (*) NONE SEEN   [place] Transvaginal Non-ob  01/13/2014   CLINICAL DATA:  23 year old G6 P3 SAB3, LMP 12/31/2013, with lower abdominal pain and pelvic pain. Evaluate for intrauterine device positioning.  EXAM: TRANSABDOMINAL AND TRANSVAGINAL ULTRASOUND OF PELVIS  TECHNIQUE: Both transabdominal and transvaginal ultrasound examinations of the pelvis were performed. Transabdominal technique was performed for global imaging of the pelvis including uterus, ovaries, adnexal regions, and pelvic cul-de-sac. It was necessary to proceed with endovaginal exam following the transabdominal exam to visualize the endometrium and ovaries as the bladder was incompletely distended.  COMPARISON:  All prior pelvic ultrasounds were OB ultrasound examinations.  FINDINGS: Uterus  Measurements: Approximately 8.7 x 3.9 x 5.7 cm. Homogeneous echotexture without focal fibroid or other myometrial abnormality.  Normal-appearing uterine cervix.  Endometrium  Thickness: Approximately 3 mm. Normal appearance without evidence of endometrial fluid or mass. Intrauterine device appropriately positioned in the fundal endometrium without evidence of extrusion.  Right ovary  Measurements: Approximately 4.3 x 2.4 x 2.7 cm. Small follicular cysts. No dominant cyst or solid mass. Normal color Doppler flow within the ovary.  Left ovary  Measurements: Approximately 3.5 x 2.8 x 2.3 cm. Small follicular cysts. No dominant cyst or solid mass. Normal color Doppler flow within the ovary.  Other findings  No free fluid.  IMPRESSION: Normal examination. Specifically, intrauterine device appropriately positioned within the fundal endometrium and there is no  evidence of extrusion of the device.   Electronically Signed   By: Hulan Saas M.D.   On: 01/13/2014 12:14   US Pelvis Complete  01/13/2014   CLINICAL DATA:  23 year old G6 P3 SAB3, LMP 12/31/2013, with lower abdominal pain and pelvic pain. Evaluate for intrauterine device positioning.  EXAM: TRANSABDOMINAL AND TRANSVAGINAL ULTRASOUND OF PELVIS  TECHNIQUE: Both transabdominal and transvaginal ultrasound examinations of the pelvis were performed. Transabdominal technique was performed for global imaging of the pelvis including uterus, ovaries, adnexal regions, and pelvic cul-de-sac. It was necessary to proceed with endovaginal exam following the transabdominal exam to visualize the endometrium and ovaries as the bladder was incompletely distended.  COMPARISON:  All prior pelvic ultrasounds were OB ultrasound examinations.  FINDINGS: Uterus  Measurements: Approximately 8.7 x 3.9 x 5.7 cm. Homogeneous echotexture without focal fibroid or other myometrial abnormality. Normal-appearing uterine cervix.  Endometrium  Thickness: Approximately 3 mm. Normal appearance without evidence of endometrial fluid or mass. Intrauterine device appropriately positioned in the fundal endometrium without  evidence of extrusion.  Right ovary  Measurements: Approximately 4.3 x 2.4 x 2.7 cm. Small follicular cysts. No dominant cyst or solid mass. Normal color Doppler flow within the ovary.  Left ovary  Measurements: Approximately 3.5 x 2.8 x 2.3 cm. Small follicular cysts. No dominant cyst or solid mass. Normal color Doppler flow within the ovary.  Other findings  No free fluid.  IMPRESSION: Normal examination. Specifically, intrauterine device appropriately positioned within the fundal endometrium and there is no evidence of extrusion of the device.   Electronically Signed   By: Hulan Saas M.D.   On: 01/13/2014 12:14    MAU Course  Procedures None  MDM UPT - negative UA, wet prep, GC/Chlamydia and [place] today Percocet given in MAU - patient states improvement in symptoms Discussed with Stinson. Rocephin and Zithormax today  Assessment and Plan  A: Pelvic pain  P: Discharge home Rx for Percocet given to patient Patient advised to follow-up with [facility] as needed or if she would like to discuss BTL or other contraception choices Patient advised that if pain persists after treatment she should follow-up with her PCP Patient may return to MAU as needed or if her condition were to change or worsen  Freddi Starr, PA-C  01/13/2014, 1:33 PM

## 2014-01-13 NOTE — MAU Provider Note (Signed)
Attestation of Attending Supervision of Advanced Practitioner (PA/CNM/NP): Evaluation and management procedures were performed by the Advanced Practitioner under my supervision and collaboration.  I have reviewed the Advanced Practitioner's note and chart, and I agree with the management and plan.  Jacob Stinson, DO Attending Physician Faculty Practice, [facility]  

## 2014-01-14 LAB — GC/CHLAMYDIA PROBE AMP
CT Probe RNA: NEGATIVE
GC PROBE AMP APTIMA: NEGATIVE

## 2014-06-18 ENCOUNTER — Encounter (HOSPITAL_COMMUNITY): Payer: Self-pay | Admitting: Emergency Medicine

## 2014-06-18 ENCOUNTER — Emergency Department (HOSPITAL_COMMUNITY)
Admission: EM | Admit: 2014-06-18 | Discharge: 2014-06-18 | Disposition: A | Discharge status: Home / Self Care | Payer: Medicaid Other | Attending: Emergency Medicine | Admitting: Emergency Medicine

## 2014-06-18 ENCOUNTER — Emergency Department (HOSPITAL_COMMUNITY)
Admission: EM | Admit: 2014-06-18 | Discharge: 2014-06-18 | Disposition: A | Discharge status: Nursing Home (Includes ICF) | Payer: Medicaid Other | Source: Home / Self Care | Attending: Emergency Medicine | Admitting: Emergency Medicine

## 2014-06-18 DIAGNOSIS — M549 Dorsalgia, unspecified: Secondary | ICD-10-CM | POA: Diagnosis present

## 2014-06-18 DIAGNOSIS — M545 Low back pain, unspecified: Secondary | ICD-10-CM | POA: Insufficient documentation

## 2014-06-18 DIAGNOSIS — Z79899 Other long term (current) drug therapy: Secondary | ICD-10-CM | POA: Insufficient documentation

## 2014-06-18 DIAGNOSIS — R269 Unspecified abnormalities of gait and mobility: Secondary | ICD-10-CM | POA: Diagnosis not present

## 2014-06-18 DIAGNOSIS — F17200 Nicotine dependence, unspecified, uncomplicated: Secondary | ICD-10-CM | POA: Insufficient documentation

## 2014-06-18 DIAGNOSIS — Z8669 Personal history of other diseases of the nervous system and sense organs: Secondary | ICD-10-CM | POA: Diagnosis not present

## 2014-06-18 HISTORY — DX: Sciatica, unspecified side: M54.30

## 2014-06-18 MED ORDER — HYDROCODONE-ACETAMINOPHEN 5-325 MG PO TABS: 2.0000 | ORAL_TABLET | ORAL | Status: AC | PRN

## 2014-06-18 MED ORDER — HYDROCODONE-ACETAMINOPHEN 5-325 MG PO TABS
2.0000 | ORAL_TABLET | Freq: Once | ORAL | Status: AC
  Administered 2014-06-18: 2 via ORAL

## 2014-06-18 MED ORDER — HYDROCODONE-ACETAMINOPHEN 5-325 MG PO TABS
ORAL_TABLET | ORAL | Status: AC
  Filled 2014-06-18: qty 2

## 2014-06-18 MED ORDER — CYCLOBENZAPRINE HCL 10 MG PO TABS
10.0000 mg | ORAL_TABLET | Freq: Once | ORAL | Status: AC
  Administered 2014-06-18: 10 mg via ORAL
  Filled 2014-06-18: qty 1

## 2014-06-18 MED ORDER — CYCLOBENZAPRINE HCL 10 MG PO TABS: 10.0000 mg | ORAL_TABLET | Freq: Two times a day (BID) | ORAL | Status: AC | PRN

## 2014-06-18 NOTE — ED Provider Notes (Signed)
Medical screening examination/treatment/procedure(s) were performed by non-physician practitioner and as supervising physician I was immediately available for consultation/collaboration.   EKG Interpretation None       Doug Sou, MD 06/18/14 1709

## 2014-06-18 NOTE — ED Provider Notes (Signed)
Medical screening examination/treatment/procedure(s) were performed by non-physician practitioner and as supervising physician I was immediately available for consultation/collaboration.  Leslee Home, M.D.  Reuben Likes, MD 06/18/14 [phone]

## 2014-06-18 NOTE — ED Notes (Signed)
Pt sent here from [facility]. Was lifting her child 3 days ago and onset of lower back pain. Pt denies urinary or bowel incontinence but reports having constant feeling of needing to have bowel movement and having genital and left leg numbness.

## 2014-06-18 NOTE — Discharge Instructions (Signed)
Return to the emergency room with worsening of symptoms, new symptoms or with symptoms that are concerning, especially loss of bladder and bowel control, weakness, cannot walk. Please call your doctor for a followup appointment within 24-48 hours. When you talk to your doctor please let them know that you were seen in the emergency department and have them acquire all of your records so that they can discuss the findings with you and formulate a treatment plan to fully care for your new and ongoing problems. If you do not have a PCP call the below number and establish care and follow up. RICE: Rest, Ice (three cycles of 20 mins on, 19mns off at least twice a day), compression/brace, elevation. Heating pad works well for back pain. Ibuprofen 403m(2 tablets 20060mevery 5-6 hours for 3-5 days and then as needed for pain. Follow up with PCP/orthopedist if symptoms worsen or are persistent. Do not operate machinery, drive or drink alcohol while taking narcotics or muscle relaxants.   Back Injury Prevention Back injuries can be extremely painful and difficult to heal. After having one back injury, you are much more likely to experience another later on. It is important to learn how to avoid injuring or re-injuring your back. The following tips can help you to prevent a back injury. PHYSICAL FITNESS  Exercise regularly and try to develop good tone in your abdominal muscles. Your abdominal muscles provide a lot of the support needed by your back.  Do aerobic exercises (walking, jogging, biking, swimming) regularly.  Do exercises that increase balance and strength (tai chi, yoga) regularly. This can decrease your risk of falling and injuring your back.  Stretch before and after exercising.  Maintain a healthy weight. The more you weigh, the more stress is placed on your back. For every pound of weight, 10 times that amount of pressure is placed on the back. DIET  Talk to your caregiver about how  much calcium and vitamin D you need per day. These nutrients help to prevent weakening of the bones (osteoporosis). Osteoporosis can cause broken (fractured) bones that lead to back pain.  Include good sources of calcium in your diet, such as dairy products, green, leafy vegetables, and products with calcium added (fortified).  Include good sources of vitamin D in your diet, such as milk and foods that are fortified with vitamin D.  Consider taking a nutritional supplement or a multivitamin if needed.  Stop smoking if you smoke. POSTURE  Sit and stand up straight. Avoid leaning forward when you sit or hunching over when you stand.  Choose chairs with good low back (lumbar) support.  If you work at a desk, sit close to your work so you do not need to lean over. Keep your chin tucked in. Keep your neck drawn back and elbows bent at a right angle. Your arms should look like the letter "L."  Sit high and close to the steering wheel when you drive. Add a lumbar support to your car seat if needed.  Avoid sitting or standing in one position for too long. Take breaks to get up, stretch, and walk around at least once every hour. Take breaks if you are driving for long periods of time.  Sleep on your side with your knees slightly bent, or sleep on your back with a pillow under your knees. Do not sleep on your stomach. LIFTING, TWISTING, AND REACHING  Avoid heavy lifting, especially repetitive lifting. If you must do heavy lifting:  Stretch before  lifting.  Work slowly.  Rest between lifts.  Use carts and dollies to move objects when possible.  Make several small trips instead of carrying 1 heavy load.  Ask for help when you need it.  Ask for help when moving big, awkward objects.  Follow these steps when lifting:  Stand with your feet shoulder-width apart.  Get as close to the object as you can. Do not try to pick up heavy objects that are far from your body.  Use handles or  lifting straps if they are available.  Bend at your knees. Squat down, but keep your heels off the floor.  Keep your shoulders pulled back, your chin tucked in, and your back straight.  Lift the object slowly, tightening the muscles in your legs, abdomen, and buttocks. Keep the object as close to the center of your body as possible.  When you put a load down, use these same guidelines in reverse.  Do not:  Lift the object above your waist.  Twist at the waist while lifting or carrying a load. Move your feet if you need to turn, not your waist.  Bend over without bending at your knees.  Avoid reaching over your head, across a table, or for an object on a high surface. OTHER TIPS  Avoid wet floors and keep sidewalks clear of ice to prevent falls.  Do not sleep on a mattress that is too soft or too hard.  Keep items that are used frequently within easy reach.  Put heavier objects on shelves at waist level and lighter objects on lower or higher shelves.  Find ways to decrease your stress, such as exercise, massage, or relaxation techniques. Stress can build up in your muscles. Tense muscles are more vulnerable to injury.  Seek treatment for depression or anxiety if needed. These conditions can increase your risk of developing back pain. SEEK MEDICAL CARE IF:  You injure your back.  You have questions about diet, exercise, or other ways to prevent back injuries. MAKE SURE YOU:  Understand these instructions.  Will watch your condition.  Will get help right away if you are not doing well or get worse. Document Released: 10/30/2004 Document Revised: 12/15/2011 Document Reviewed: 11/03/2011 [facility] Patient Information 2015 [place], [place]. This information is not intended to replace advice given to you by your health care provider. Make sure you discuss any questions you have with your health care provider.     Emergency Department Resource Guide 1) Find a Doctor and Pay  Out of Pocket Although you won't have to find out who is covered by your insurance plan, it is a good idea to ask around and get recommendations. You will then need to call the office and see if the doctor you have chosen will accept you as a new patient and what types of options they offer for patients who are self-pay. Some doctors offer discounts or will set up payment plans for their patients who do not have insurance, but you will need to ask so you aren't surprised when you get to your appointment.  2) Contact Your Local Health Department Not all health departments have doctors that can see patients for sick visits, but many do, so it is worth a call to see if yours does. If you don't know where your local health department is, you can check in your phone book. The CDC also has a tool to help you locate your state's health department, and many state websites also have listings  of all of their local health departments.  3) Find a [facility] If your illness is not likely to be very severe or complicated, you may want to try a walk in clinic. These are popping up all over the country in pharmacies, drugstores, and shopping centers. They're usually staffed by nurse practitioners or physician assistants that have been trained to treat common illnesses and complaints. They're usually fairly quick and inexpensive. However, if you have serious medical issues or chronic medical problems, these are probably not your best option.  No Primary Care Doctor: - Call Health Connect at  [phone] - they can help you locate a primary care doctor that  accepts your insurance, provides certain services, etc. - Physician Referral Service- [phone]  Chronic Pain Problems: Organization         Address  Phone   Notes  [facility]  [phone] Patients need to be referred by their primary care doctor.   Medication Assistance: Organization         Address  Phone   Notes  [facility]  Medication [facility] [address] [place], [place] [address] [phone] --Must be a resident of [facility] -- Must have NO insurance coverage whatsoever (no Medicaid/ Medicare, etc.) -- The pt. MUST have a primary care doctor that directs their care regularly and follows them in the community   MedAssist  [phone]   [employer]  [phone]    Agencies that provide inexpensive medical care: Organization         Address  Phone   Notes  [place]  [phone]   [facility]    [phone]   [facility] [place], [place] [address] [phone]   [place] [address] [place] [phone]   Planned Parenthood    [phone]   [facility]    [phone]   [place] [address], [place] Phone:  [phone], Fax:  [phone] Hours of Operation:  9 am - 6 pm, M-F.  Also accepts Medicaid/Medicare and self-pay.  [facility] for [place], [address], [place] Phone: [phone], Fax: [phone]. Hours of Operation:  8:30 am - 5:30 pm, M-F.  Also accepts Medicaid and self-pay.  [facility] [address], [place] Phone: [phone]   [place], [place], [place] [phone], Ext. [phone] Mondays & Thursdays: 7-9 AM.  First 15 patients are seen on a first come, first serve basis.    [place] Providers:  Organization         Address  Phone   Notes  [facility] [address],  [phone] Also accepts self-pay patients.  [facility] [address], [place]  [phone]   [place], [address], [place] [phone]   [facility] [address], [place] [phone]   Lucianne Lei [address], [place]   [phone] Only accepts [place] Access [place] patients after they have their name applied to their card.   Self-Pay (no insurance) in [facility]:  Patent attorney   Notes  Sickle Cell Patients, [facility] [place] [phone]   [facility] [place] [phone]   [facility] [place]  [place], [address], [place] [phone]   [facility]/Dr. Osei-Bonsu  [address], [place] or [address], [place] [phone] Phone number for both [place] and [place] locations is the same.  [facility] [address] [place] [phone]   [facility] [address], [place] or [address] [phone]   [facility] [address] [place] [phone], phone; [phone], fax Sees patients 1st and 3rd Saturday of every month.  Must not qualify for public or private insurance (i.e. Medicaid, Medicare, [place] Health Choice, Veterans' Benefits)  Household income should be no more than 200% of the poverty level The clinic cannot treat you if you are pregnant or think you are pregnant  Sexually transmitted diseases are not treated at the clinic.    Dental Care: Organization         Address  Phone  Notes  [facility] of [facility] [phone] Accepts children up to age 50 who are enrolled in [place] or [place]; pregnant women with a Medicaid card; and children who have applied for Medicaid or [place] Health Choice, but were declined, whose parents can pay a reduced fee at time of service.  [facility] of [facility]  [address], [place]  [phone] Accepts children up to age 55 who are enrolled in [place] or [place]; pregnant women with a Medicaid card; and children who have applied for Medicaid or [place] Health Choice, but were declined, whose parents can pay a reduced fee at time of service.  [place] Adult [facility] Access PROGRAM  [place] [phone] Patients are seen by appointment only. Walk-ins are not accepted. [place] will see patients 42 years of age and older. Monday - Tuesday (8am-5pm) Most Wednesdays (8:30-5pm) $30 per visit, cash only  [facility] Adult Dental Access PROGRAM  [address], [facility] [phone] Patients are seen by appointment only. Walk-ins are not accepted. [place] will see patients 67 years of age and older. One Wednesday Evening (Monthly: Volunteer Based).  $30 per visit, cash only  [place]  [phone] for adults; Children under age 9, call Graduate Pediatric Dentistry at [phone]. Children aged 36-14, please call [phone] to request a pediatric application.  Dental services are provided in all areas of dental care including fillings, crowns and bridges, complete and partial dentures, implants, gum treatment, root canals, and extractions. Preventive care is also provided. Treatment is provided to both adults and children. Patients are selected via a lottery and there is often a waiting list.   [facility] [address] [place]  [phone] [email or web address]   [facility] [address] [place], [place] [phone], Ext. [phone] Second and Fourth Thursday of each month, opens at 6:30 AM; Clinic ends at 9 AM.  Patients are seen on a first-come first-served basis, and a limited number are seen during each clinic.   [facility]  [address] [place], [place] [phone]   Eligibility Requirements  You must have lived in [place], [place], or [place]  counties for at least the last three months.   You cannot be eligible for state or federal sponsored [employer], including [employer], [place], or [employer].   You generally cannot be eligible for healthcare insurance through your employer.    How to apply: Eligibility screenings are held every Tuesday and Wednesday afternoon from 1:00 pm until 4:00 pm. You do not need an appointment for the interview!  [facility] [address] [place], [place]   [place]  [place] Department  [place]  [phone]    Behavioral Health Resources in the Community: Intensive Outpatient Programs Organization         Address  Phone  Notes  [facility]. [address], [place], [place] [phone]   [facility] [address] [place], [place]   ADS: Alcohol [facility] Drug S[facility] [address] [place], [place]   [place] [address],  [place], [place] or [phone]   Substance Abuse Resources Organization         Address  Phone  Notes  Alcohol and Drug Services  [phone]   [place]  [phone]   The [place]   Chinita Pester  [phone]   Residential & Outpatient Substance Abuse Program  [phone]   Psychological Services Organization         Address  Phone  Notes  [facility] [place]  [place]  [phone]   [place] [address], [place] or [phone]    Mobile Crisis Teams Organization         Address  Phone  Notes  [facility], Mobile Crisis Care Unit  [phone]   [facility]  [address] [place], [place]   [place] [address] [place] [phone]    Self-Help/Support Groups Organization         Address  Phone             Notes  [place]. of [place] - variety of support groups  [place] Call for more information  Narcotics Anonymous (NA), [facility] [address], [employer] [place]  2 meetings at this location   Special educational needs teacher         Address  Phone  Notes  [facility] Residential Treatment [place],    [place]  [phone]   [facility]  [address] [place] [address]0109, [place], [place]   [place], [place] [phone] Admissions: 8am-3pm M-F  Incentives Substance [place] [address],    [place], [place] [phone]   [facility] [address] Jadene Pierini [place], [place]   [facility] [address]  [place], [place]   [facility] Programs - Intensive Outpatient [address], [place], [place]   [facility] [place]ba.,  [place], [place] or [phone]   [facility] (RTS) [address], [place], [place] Accepts Medicaid  [facility] [place] [address],  [place] [phone] Substance Abuse/Addiction Treatment   [facility] Resources Organization  Address  Phone  Notes  [facility]  [phone]   Domenic Schwab, PhD [address] Arlis [place], [place]   [phone] or [phone]   [place], [place] [phone]   [address], [place], [place] [phone] Insurance/Medicaid/sponsorship through [facility] and Families [address] [place]                                    [place], [place] [phone] [place] [address], [place] [phone]    Dr. Adele Schilder  [phone]   [facility] Dept. 1) [address], [place] 2) [place] 3)  [address], [place] [phone]  [phone]   [place] [phone] or [phone] (After Hours)

## 2014-06-18 NOTE — ED Notes (Signed)
Reports sudden onset right low back pain when she was swinging her to pick him up 2-3 days ago.  C/O continued severe right low back pain with numbness and burning down RLE.  Has tried stretching, IBU, hot soaks, and warm compresses without relief.

## 2014-06-18 NOTE — ED Provider Notes (Signed)
CSN: [id number]     Arrival date & time 06/18/14  1228 History   First MD Initiated Contact with Patient 06/18/14 1316     Chief Complaint  Patient presents with  . Back Pain     HPI  Shawna Michael is a 23 y.o. female sent from urgent care presenting with back pain that started 3 days ago when she caught her 23 year old. Pain was not bad but she reports that it is getting worse and that she has been sleeping on the cough and that that may be contributing as well. Back pain is right sided described as burning and sharp and radiates into the lateral right leg, and groin area. She reports left leg numbness but no weakness. Pain is worse with movement, sitting and standing. No loss of bladder or bowel control, urinary retention, IVDU, fevers, chills, night sweats or weight loss. Patient normal BM yesterday and voided today in ED. No urinary changes.   Past Medical History  Diagnosis Date  . Complication of anesthesia   . Spinal headache   . Sciatic nerve pain    Past Surgical History  Procedure Laterality Date  . Mandible fracture surgery  2003   Family History  Problem Relation Age of Onset  . Adopted: Yes   History  Substance Use Topics  . Smoking status: Current Every Day Smoker -- 0.25 packs/day  . Smokeless tobacco: Never Used     Comment: 05/11/13 is cutting back on smoking  . Alcohol Use: Yes     Comment: occasional   OB History   Grav Para Term Preterm Abortions TAB SAB Ect Mult Living   Review of Systems  Constitutional: Negative for fever and chills.  HENT: Negative for congestion and rhinorrhea.   Eyes: Negative for visual disturbance.  Respiratory: Negative for cough and shortness of breath.   Cardiovascular: Negative for chest pain and palpitations.  Gastrointestinal: Negative for nausea, vomiting and diarrhea.  Genitourinary: Negative for dysuria and hematuria.  Musculoskeletal: Positive for back pain and gait problem.  Skin: Negative for  rash.  Neurological: Negative for weakness and headaches.      Allergies  Valium  Home Medications   Prior to Admission medications   Medication Sig Start Date End Date Taking? Authorizing Provider  acetaminophen (TYLENOL) 500 MG tablet Take 1,000 mg by mouth every 6 (six) hours as needed for pain.   Yes Historical Provider, MD  Aspirin-Acetaminophen-Caffeine (EXCEDRIN PO) Take 2 tablets by mouth daily as needed. Rotates with tylenol   Yes Historical Provider, MD  FLUoxetine (PROZAC) 20 MG capsule Take 20 mg by mouth daily.   Yes Historical Provider, MD  cyclobenzaprine (FLEXERIL) 10 MG tablet Take 1 tablet (10 mg total) by mouth 2 (two) times daily as needed for muscle spasms. 06/18/14   Louann Sjogren, PA-C  HYDROcodone-acetaminophen (NORCO/VICODIN) 5-325 MG per tablet Take 2 tablets by mouth every 4 (four) hours as needed for moderate pain or severe pain. 06/18/14   Louann Sjogren, PA-C   BP 115/74  Pulse 60  Temp(Src) 97.8 F (36.6 C) (Oral)  Resp 15  SpO2 98%  LMP 06/17/2014 Physical Exam  Nursing note and vitals reviewed. Constitutional: She appears well-developed and well-nourished. No distress.  HENT:  Head: Normocephalic and atraumatic.  Eyes: Conjunctivae and EOM are normal. Right eye exhibits no discharge. Left eye exhibits no discharge. No scleral icterus.  Cardiovascular: Normal rate,  regular rhythm and normal heart sounds.   Pulmonary/Chest: Effort normal and breath sounds normal. No respiratory distress. She has no wheezes.  Abdominal: Soft. Bowel sounds are normal. She exhibits no distension. There is no tenderness.  Musculoskeletal: Normal range of motion. She exhibits no tenderness.  Mild Midline lower back tenderness without crepitus or step off. Moderate Right lower back pain with muscle tightness. No CVA tenderness or left back pain. Patient with antalgic gait. 5/5 strength in upper and lower extremities. DTR intact and symmetric bilaterally. Negative  straight leg exam.  Neurological: She is alert. She exhibits normal muscle tone. Coordination normal.  Skin: Skin is warm and dry. She is not diaphoretic.  Psychiatric: She has a normal mood and affect. Her behavior is normal.    ED Course  Procedures (including critical care time) Labs Review Labs Reviewed - No data to display  Imaging Review No results found.   EKG Interpretation None     Meds given in ED:  Medications  cyclobenzaprine (FLEXERIL) tablet 10 mg (10 mg Oral Given 06/18/14 1442)    New Prescriptions   CYCLOBENZAPRINE (FLEXERIL) 10 MG TABLET    Take 1 tablet (10 mg total) by mouth 2 (two) times daily as needed for muscle spasms.   HYDROCODONE-ACETAMINOPHEN (NORCO/VICODIN) 5-325 MG PER TABLET    Take 2 tablets by mouth every 4 (four) hours as needed for moderate pain or severe pain.      MDM   Final diagnoses:  Right-sided low back pain without sciatica   Patient with back pain.  No neurological deficits and normal neuro exam.  Patient can walk but states is painful.  No loss of bowel or bladder control.  No concern for cauda equina.  No fever, night sweats, weight loss, h/o cancer, IVDU.  RICE protocol and pain medicine, muscle relaxors indicated and discussed with patient. Patient given flexeril with significant improvement in her back pain. Patient is afebrile, nontoxic, and in no acute distress. Patient is appropriate for outpatient management and is stable for discharge. Patient does not have PCP and given resource to establish care and follow up.  Discussed return precautions with patient. Discussed all results and patient verbalizes understanding and agrees with plan.      Louann Sjogren, PA-C 06/18/14 [phone]

## 2014-06-18 NOTE — ED Provider Notes (Signed)
CSN: [id number]     Arrival date & time 06/18/14  1107 History   First MD Initiated Contact with Patient 06/18/14 1117     Chief Complaint  Patient presents with  . Back Pain   (Consider location/radiation/quality/duration/timing/severity/associated sxs/prior Treatment) Patient is a 23 y.o. female presenting with back pain.  Back Pain  23 year old female presents complaining of back pain. About 3 days ago she was going to pick up her 32-year-old child when she felt her back that. Since then she has had back pain that radiates into the lateral right leg. She has numbness in the lateral right leg, going down to the level of the knee. She has burning pain that radiates into the groin and genital area. The pain is exacerbated with any attempted movement but is not particularly relieved by sitting still. She also admits to associated bowel changes, she feels like she needs to have a bowel movement constantly since this began. No losing control of bowels or bladder.  Past Medical History  Diagnosis Date  . Complication of anesthesia   . Spinal headache   . Sciatic nerve pain    Past Surgical History  Procedure Laterality Date  . Mandible fracture surgery  2003   Family History  Problem Relation Age of Onset  . Adopted: Yes   History  Substance Use Topics  . Smoking status: Current Every Day Smoker -- 0.25 packs/day  . Smokeless tobacco: Never Used     Comment: 05/11/13 is cutting back on smoking  . Alcohol Use: Yes     Comment: occasional   OB History   Grav Para Term Preterm Abortions TAB SAB Ect Mult Living   Review of Systems  Gastrointestinal:       Tenesmus  Musculoskeletal: Positive for back pain (with numbness in the lateral right leg and burning pain into the groin and genitals).  All other systems reviewed and are negative.   Allergies  Valium  Home Medications   Prior to Admission medications   Medication Sig Start Date End Date Taking?  Authorizing Provider  acetaminophen (TYLENOL) 500 MG tablet Take 1,000 mg by mouth every 6 (six) hours as needed for pain.    Historical Provider, MD  Aspirin-Acetaminophen-Caffeine (EXCEDRIN PO) Take 2 tablets by mouth daily as needed. Rotates with tylenol    Historical Provider, MD  FLUoxetine (PROZAC) 20 MG capsule Take 1 capsule (20 mg total) by mouth daily. 12/26/13   Minta Balsam, MD  nystatin (MYCOSTATIN/NYSTOP) 100000 UNIT/GM POWD Apply 1 g topically 2 (two) times daily. 12/26/13   Minta Balsam, MD  oxyCODONE-acetaminophen (PERCOCET/ROXICET) 5-325 MG per tablet Take 1-2 tablets by mouth every 4 (four) hours as needed for severe pain. 01/13/14   Marny Lowenstein, PA-C   BP 108/63  Pulse 86  Temp(Src) 98.5 F (36.9 C) (Oral)  Resp 20  SpO2 98%  LMP 06/17/2014 Physical Exam  Nursing note and vitals reviewed. Constitutional: She is oriented to person, place, and time. Vital signs are normal. She appears well-developed and well-nourished. No distress.  HENT:  Head: Normocephalic and atraumatic.  Pulmonary/Chest: Effort normal. No respiratory distress.  Neurological: She is alert and oriented to person, place, and time. She has normal strength. No sensory deficit. Gait (antalgic) abnormal. Coordination normal.  Reflex Scores:      Patellar reflexes are 4+ on the right side and 4+ on the left side. Skin: Skin is  warm and dry. No rash noted. She is not diaphoretic.  Psychiatric: She has a normal mood and affect. Judgment normal.    ED Course  Procedures (including critical care time) Labs Review Labs Reviewed - No data to display  Imaging Review No results found.   MDM   1. Midline low back pain without sciatica    Back pain with red flags of bowel changes, radiating into the groin and genitals, hyperreflexive, transferred to ED for further evaluation.   Meds ordered this encounter  Medications  . HYDROcodone-acetaminophen (NORCO/VICODIN) 5-325 MG per tablet 2 tablet     Sig:        Graylon Good, PA-C 06/18/14 1211

## 2014-06-18 NOTE — ED Notes (Signed)
Report called to [facility], ED First Nurse.

## 2014-08-07 ENCOUNTER — Encounter (HOSPITAL_COMMUNITY): Payer: Self-pay | Admitting: Emergency Medicine

## 2014-09-21 IMAGING — US US OB COMP +14 WK
1 series · 12 of 28 positions shown · non-contrast
Comparison: none

[Series 1: us ob comp less 14 wks · 66 acquisitions, 12 frames shown]
[im 3/66]
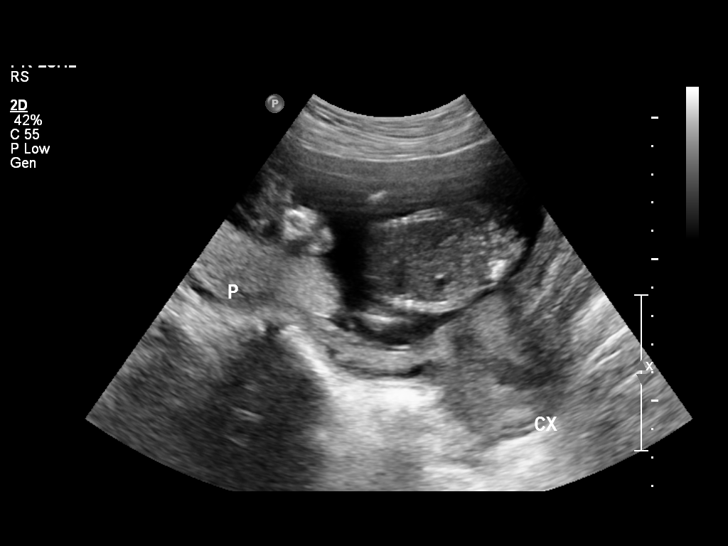
[im 8/66]
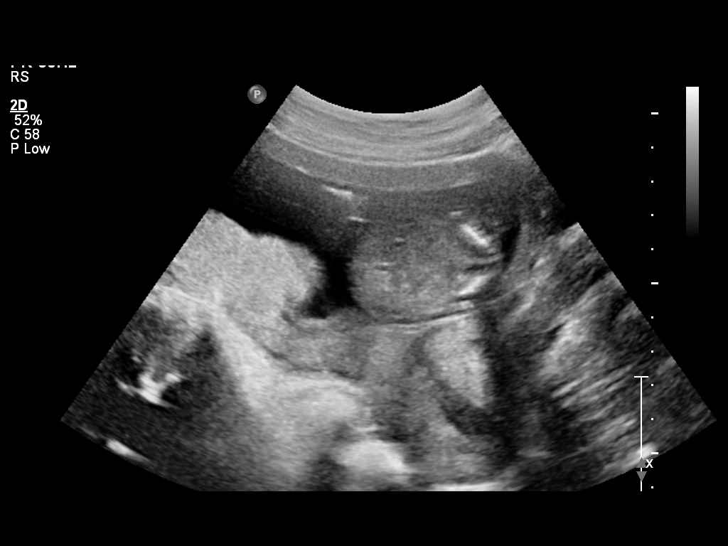
[im 13/66]
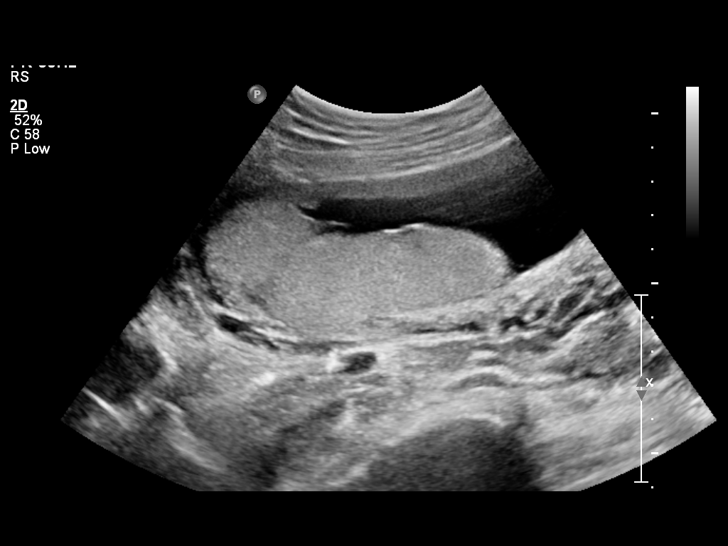
[im 20/66]
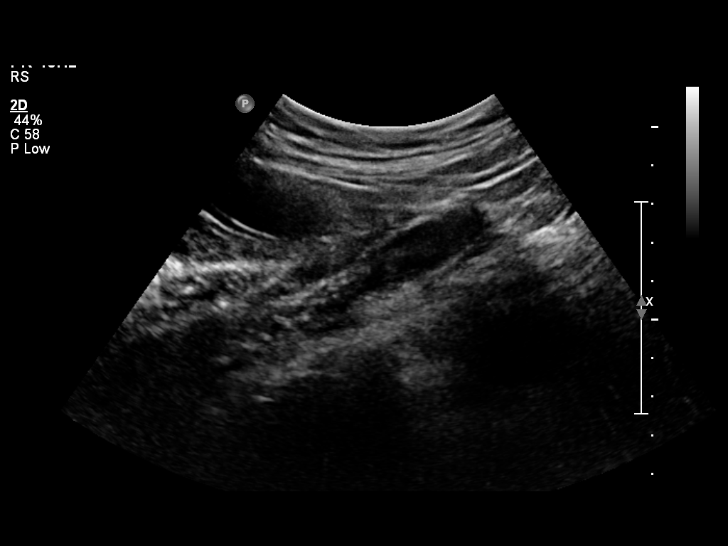
[im 25/66]
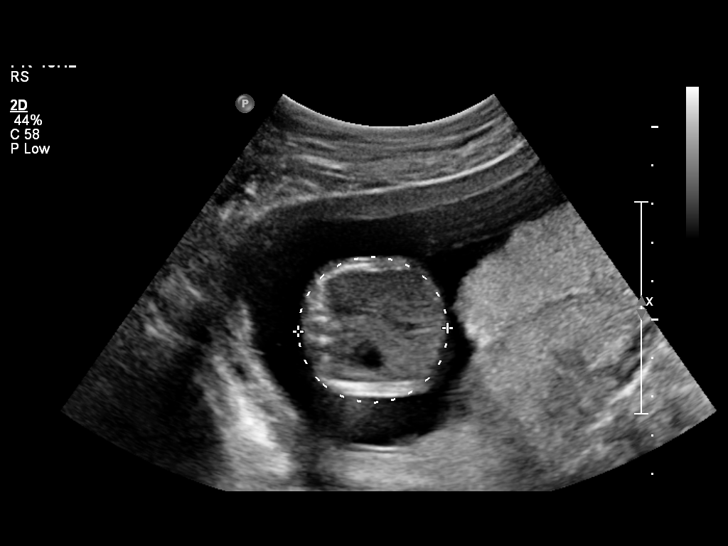
[im 29/66]
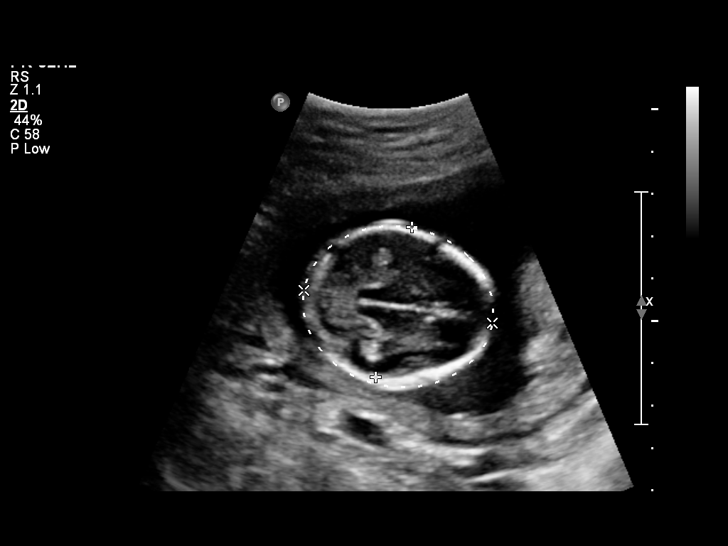
[im 37/66]
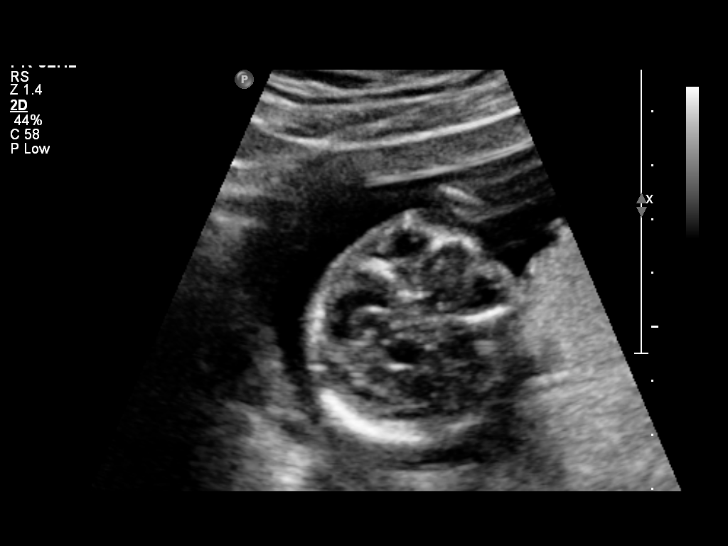
[im 41/66]
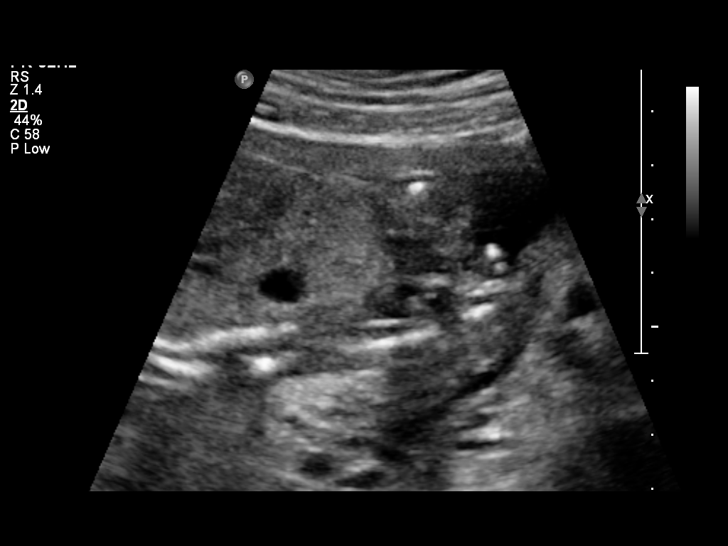
[im 46/66]
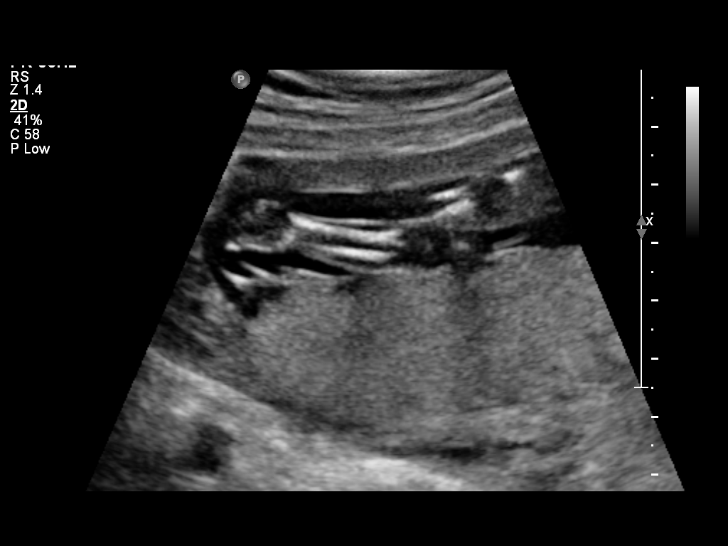
[im 53/66]
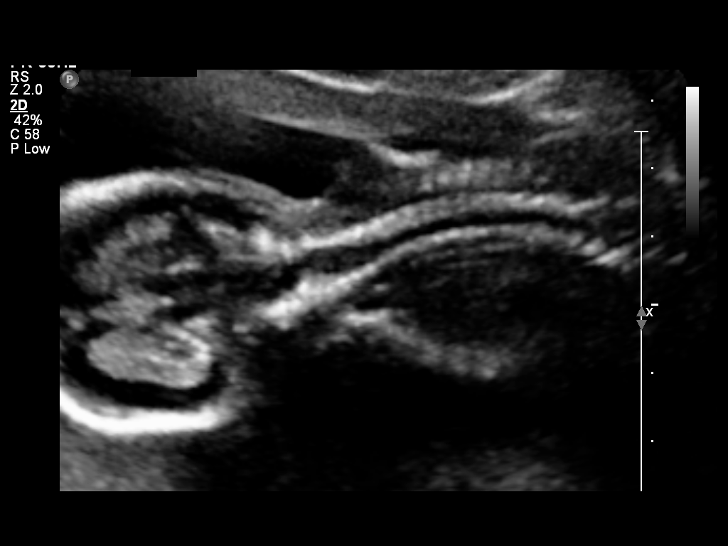
[im 58/66]
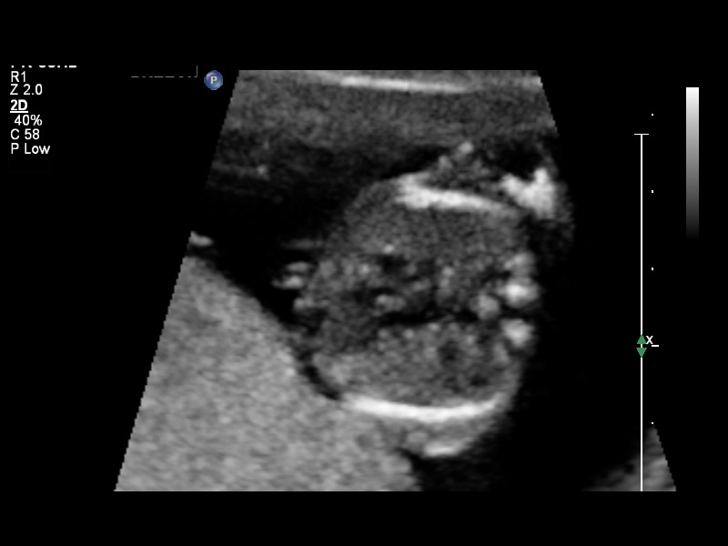
[im 63/66]
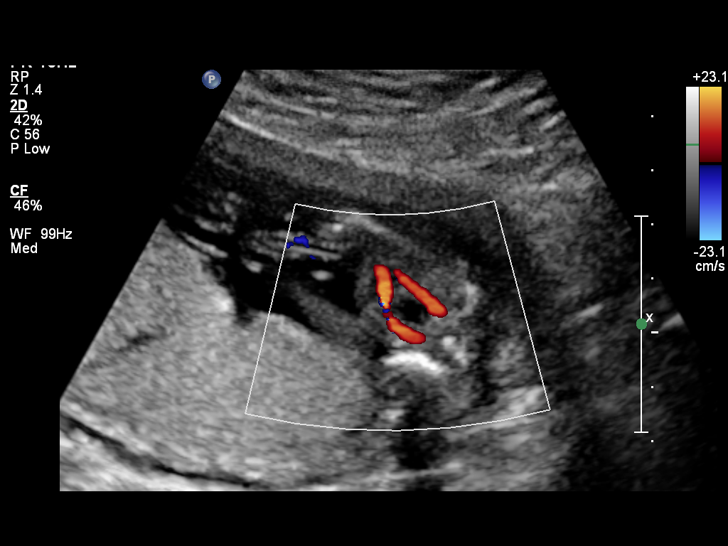

[12 of 28 positions shown; findings below may reference images not displayed]

OBSTETRICS REPORT
                      (Signed Final 05/13/2013 [DATE])

Service(s) Provided

 US OB COMP + 14 WK                                    76805.1
Indications

 Size-Date Discrepancy
Fetal Evaluation

 Num Of Fetuses:    1
 Fetal Heart Rate:  160                         bpm
 Cardiac Activity:  Observed
 Presentation:      Breech
 Placenta:          Posterior, above cervical
                    os
 P. Cord            Visualized
 Insertion:

 Amniotic Fluid
 AFI FV:      Subjectively within normal limits
                                             Larg Pckt:     4.1  cm
Biometry

 CRL:    111.2  mm    G. Age:   N/A                    EDD:

 BPD:     35.8  mm    G. Age:   17w 0d                CI:        76.58   70 - 86
                                                      FL/HC:      16.4   14.6 -

 HC:     129.6  mm    G. Age:   16w 4d       26  %    HC/AC:      1.11   1.07 -

 AC:     116.4  mm    G. Age:   17w 3d       67  %    FL/BPD:
 FL:      21.3  mm    G. Age:   16w 3d       27  %    FL/AC:      18.3   20 - 24
 NFT:     2.79  mm

 Est. FW:     172  gm      0 lb 6 oz     57  %
Gestational Age

 LMP:           12w 6d       Date:   02/12/13                 EDD:   11/19/13
 U/S Today:     16w 6d                                        EDD:   10/22/13
 Best:          16w 6d    Det. By:   U/S (05/13/13)           EDD:   10/22/13
Anatomy

 Cranium:          Appears normal         Aortic Arch:      Not well visualized
 Fetal Cavum:      Appears normal         Ductal Arch:      Not well visualized
 Ventricles:       Appears normal         Diaphragm:        Not well visualized
 Choroid Plexus:   Appears normal         Stomach:          Appears normal, left
                                                            sided
 Cerebellum:       Appears normal         Abdomen:          Appears normal
 Posterior Fossa:  Appears normal         Abdominal Wall:   Appears nml (cord
                                                            insert, abd wall)
 Nuchal Fold:      Appears normal         Cord Vessels:     Appears normal (3
                                                            vessel cord)
 Face:             Appears normal         Kidneys:          Appear normal
                   (orbits and profile)
 Lips:             Not well visualized    Bladder:          Appears normal
 Heart:            Not well visualized    Spine:            Not well visualized
 RVOT:             Not well visualized    Lower             Appears normal
                                          Extremities:
 LVOT:             Not well visualized    Upper             Appears normal
                                          Extremities:

 Other:  Technically difficult due to early GA.
Cervix Uterus Adnexa

 Cervical Length:   3.8       cm

 Cervix:       Normal appearance by transabdominal scan.
 Uterus:       No abnormality visualized.
 Cul De Sac:   No free fluid seen.
 Left Ovary:   Not visualized.
 Right Ovary:  Not visualized.

 Adnexa:     No abnormality visualized.
Impression

 Single living IUP with US Gest. Age of 16w 6d, and EDD of
 10/22/2013.
 Normal amniotic fluid volume and cervical length.
 The visualized fetal anatomy appears normal.
Recommendations

 Ultrasound for complete anatomic evaluation at 19 to 20
 weeks GA is recommended.

 questions or concerns.

## 2014-10-08 IMAGING — US US OB FOLLOW-UP
1 series · 12 of 28 positions shown · non-contrast
Comparison: none

[Series 1: us ob follow up · 12 of 84 slices shown]
[im 4/84]
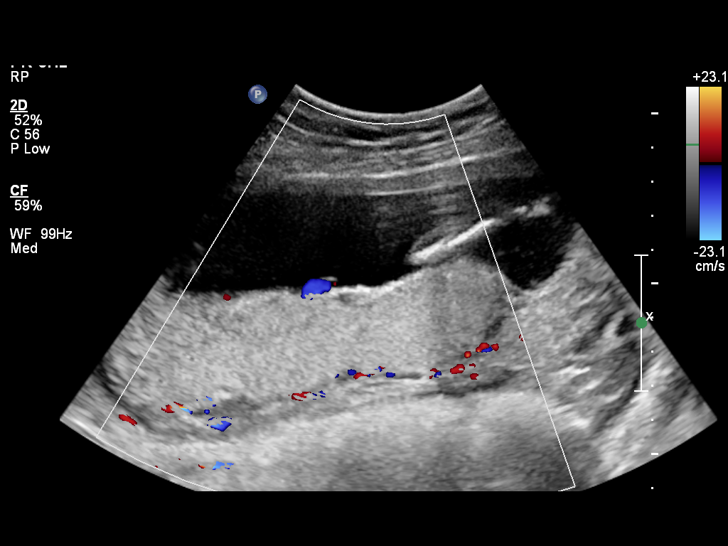
[im 10/84]
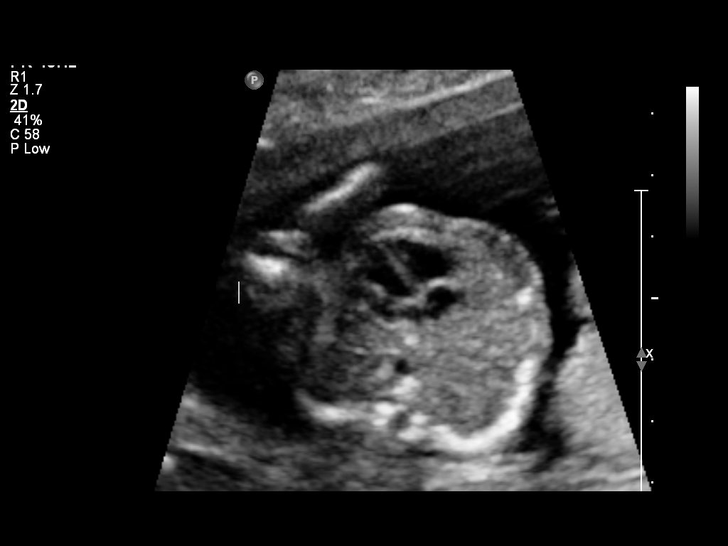
[im 16/84]
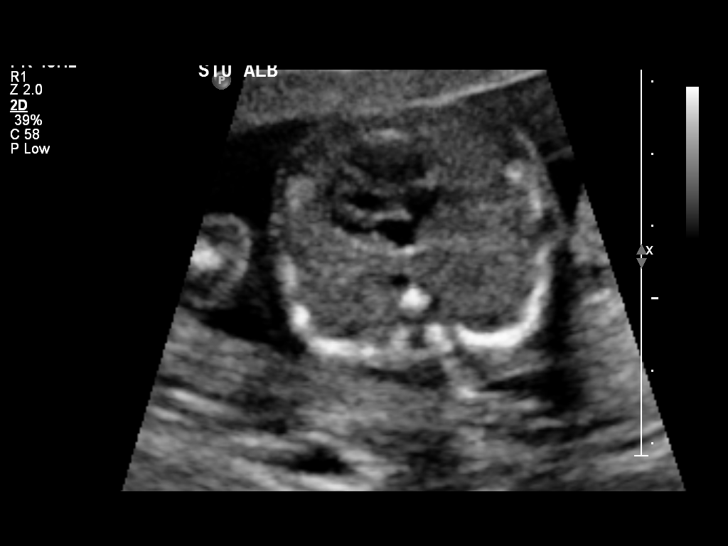
[im 25/84]
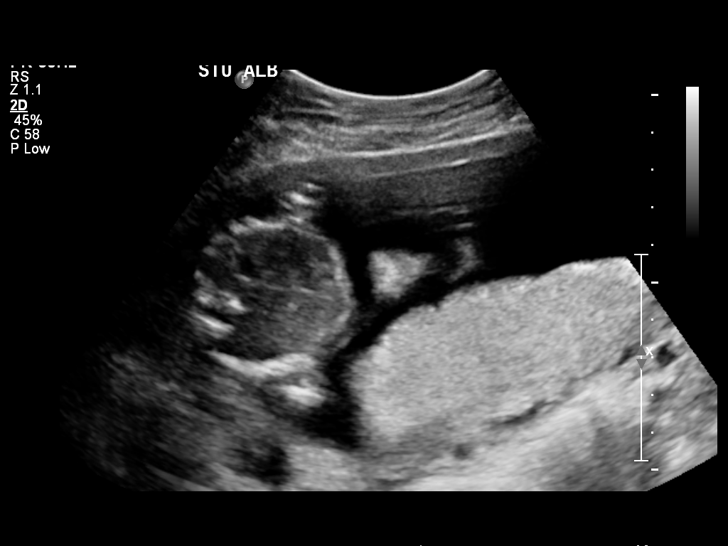
[im 31/84]
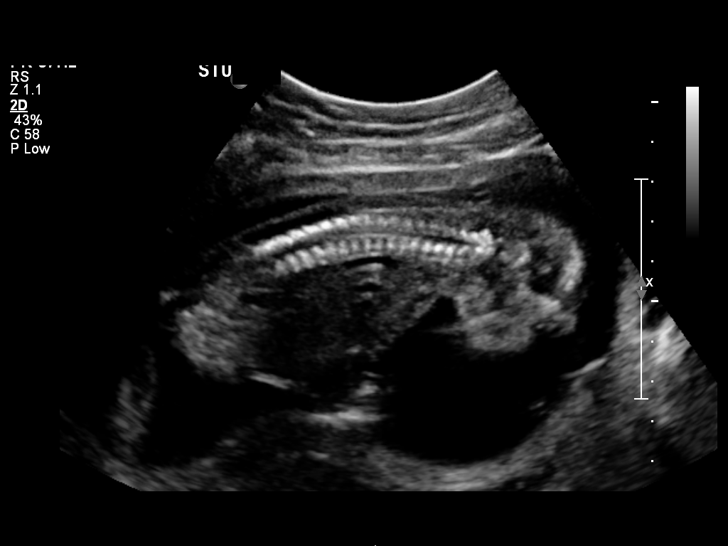
[im 37/84]
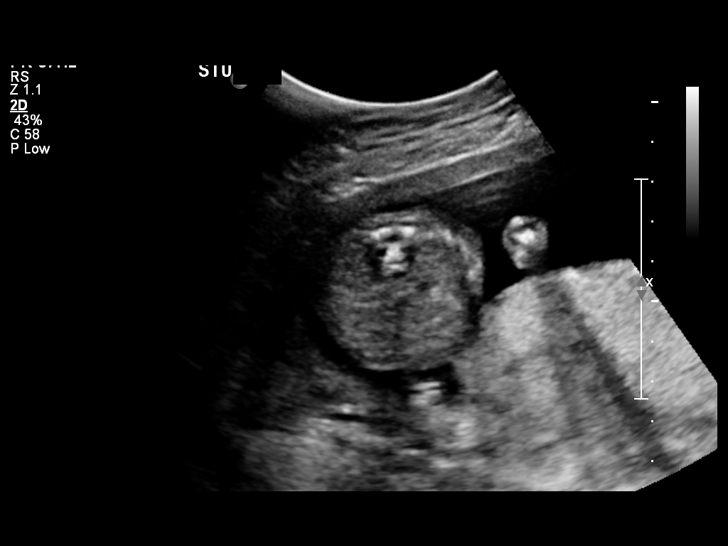
[im 47/84]
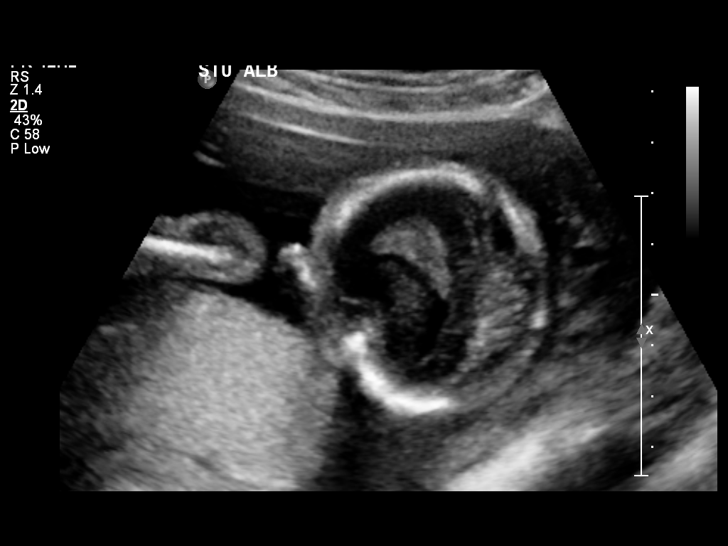
[im 53/84]
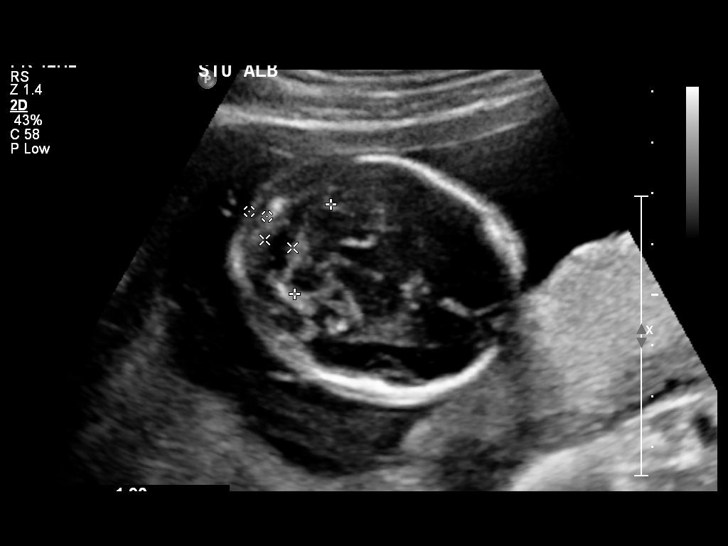
[im 59/84]
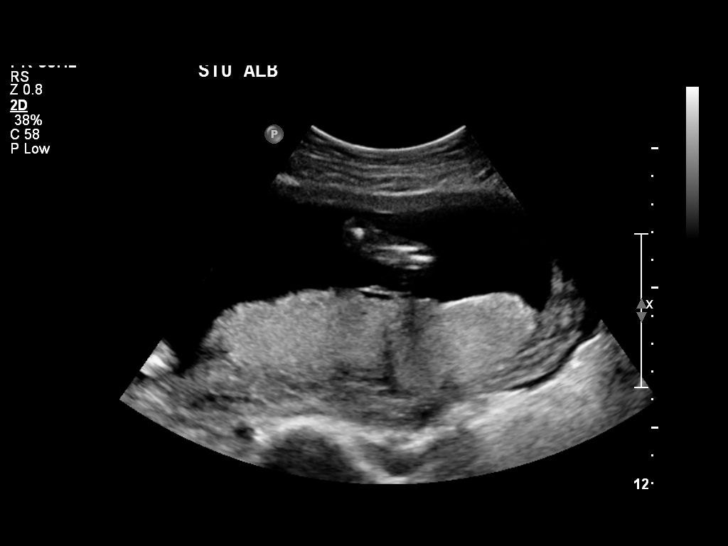
[im 68/84]
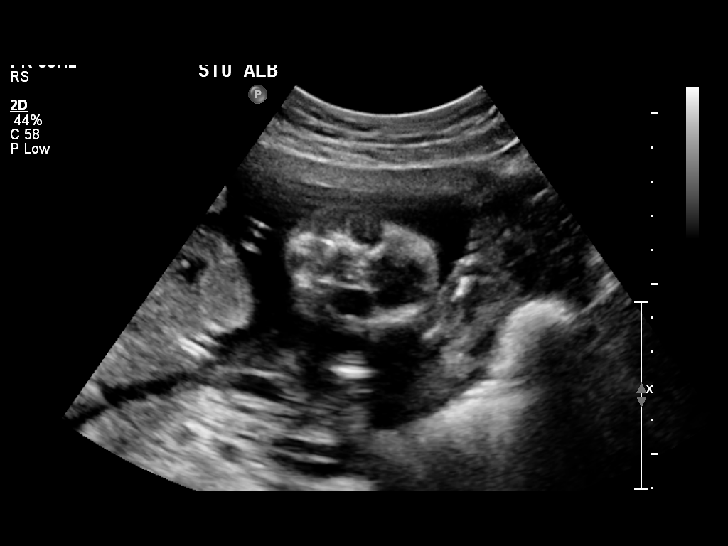
[im 74/84]
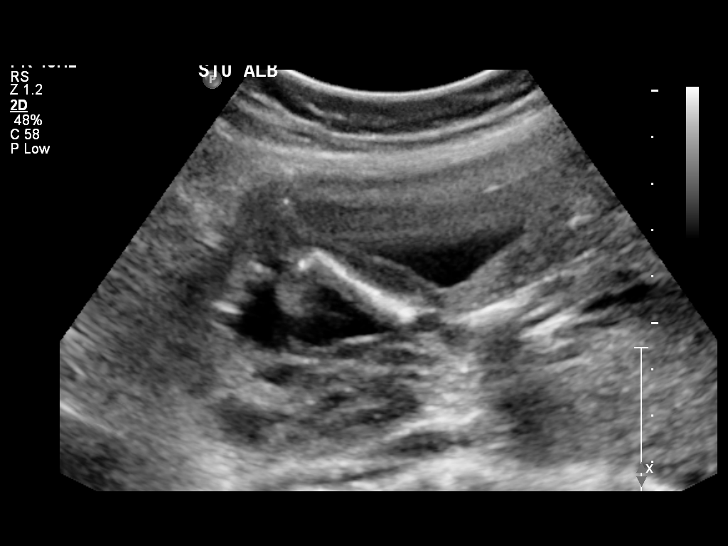
[im 80/84]
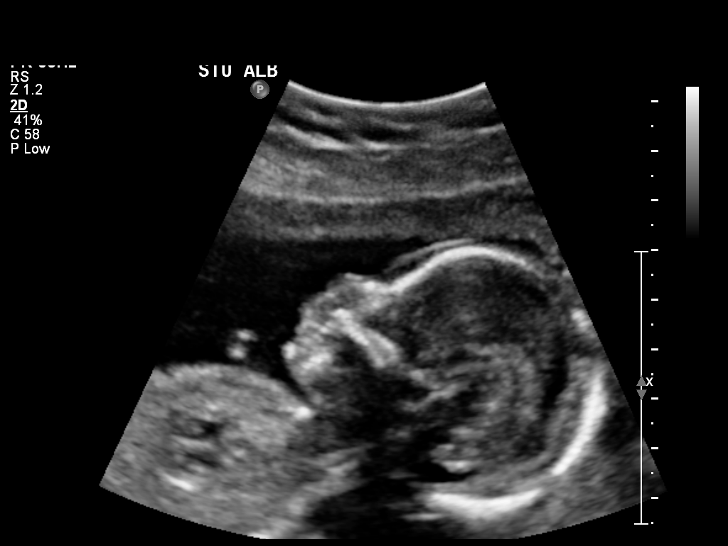

[12 of 28 positions shown; findings below may reference images not displayed]

OBSTETRICS REPORT
                      (Signed Final 05/30/2013 [DATE])

Service(s) Provided

 US OB FOLLOW UP                                       76816.1
Indications

 Follow-up incomplete fetal anatomic evaluation
Fetal Evaluation

 Num Of Fetuses:    1
 Fetal Heart Rate:  160                         bpm
 Cardiac Activity:  Observed
 Presentation:      Cephalic
 Placenta:          Posterior, above cervical
                    os
 P. Cord            Visualized, central
 Insertion:

 Amniotic Fluid
 AFI FV:      Subjectively within normal limits
                                             Larg Pckt:   4.27   cm
Biometry

 BPD:     44.1  mm    G. Age:   19w 2d                CI:        76.88   70 - 86
                                                      FL/HC:      18.2   16.1 -

 HC:     159.3  mm    G. Age:   18w 6d       21  %
                                                      FL/BPD:
 FL:        29  mm    G. Age:   18w 6d       30  %
 HUM:     28.1  mm    G. Age:   19w 0d       45  %
 CER:     18.9  mm    G. Age:   18w 3d       27  %
 NFT:      3.2  mm
Gestational Age

 LMP:           15w 2d       Date:   02/12/13                 EDD:   11/19/13
 U/S Today:     19w 0d                                        EDD:   10/24/13
 Best:          19w 2d    Det. By:   U/S (05/13/13)           EDD:   10/22/13
2nd Trimester Genetic Sonogram - Trisomy 21 Screening

 Age:                                             22          Risk=1:   885

 Structural anomalies (inc. cardiac):             No
 Echogenic bowel:                                 No
 Hypoplastic / absent midphalanx 5th Digit:       No
 Wide space 1st-7nd toes:                         N/A
 Pyelectasis:                                     No
 2-vessel umbilical cord:                         No
 Echogenic cardiac foci:                          Yes
Anatomy

 Cranium:          Appears normal         Aortic Arch:      Appears normal
 Fetal Cavum:      Appears normal         Ductal Arch:      Appears normal
 Ventricles:       Appears normal         Diaphragm:        Appears normal
 Choroid Plexus:   Appears normal         Stomach:          Appears normal
 Cerebellum:       Appears normal         Abdomen:          Appears normal
 Posterior Fossa:  Appears normal         Abdominal Wall:   Appears nml (cord
                                                            insert, abd wall)
 Nuchal Fold:      Appears normal         Cord Vessels:     Appears normal (3
                                                            vessel cord)
 Face:             Appears normal         Kidneys:          Appear normal
                   (orbits and profile)
 Lips:             Appears normal         Bladder:          Appears normal
 Heart:            Echogenic focus        Spine:            Appears normal
                   in LV
 RVOT:             Appears normal         Lower             Previously seen
                                          Extremities:
 LVOT:             Appears normal         Upper             Previously seen
                                          Extremities:

 Other:  Male gender. Heels and 5th digit appear normal. Nasal bone
         visualized.
Targeted Anatomy

 Fetal Central Nervous System
 Cisterna Magna:
Cervix Uterus Adnexa

 Cervical Length:   4.32      cm

 Cervix:       Normal appearance by transabdominal scan.
 Uterus:       No abnormality visualized.
 Cul De Sac:   No free fluid seen.

 Left Ovary:   Not visualized.
 Right Ovary:  Not visualized.
 Adnexa:     No abnormality visualized.
Impression

 Single living IUP with assigned GA of 19w 2d. Appropriate
 interval fetal growth.
 Echogenic intracardiac focus noted, which is a soft marker for
 Trisomy 21. No additional soft markers are seen.
 The visualized fetal anatomy appears normal.
 Normal amniotic fluid volume and cervical length.

 questions or concerns.

## 2015-01-03 IMAGING — US US OB FOLLOW-UP
1 series · 12 of 28 positions shown · non-contrast
Comparison: none

[Series 1: us ob follow up · 84 acquisitions, 12 frames shown]
[im 4/84]
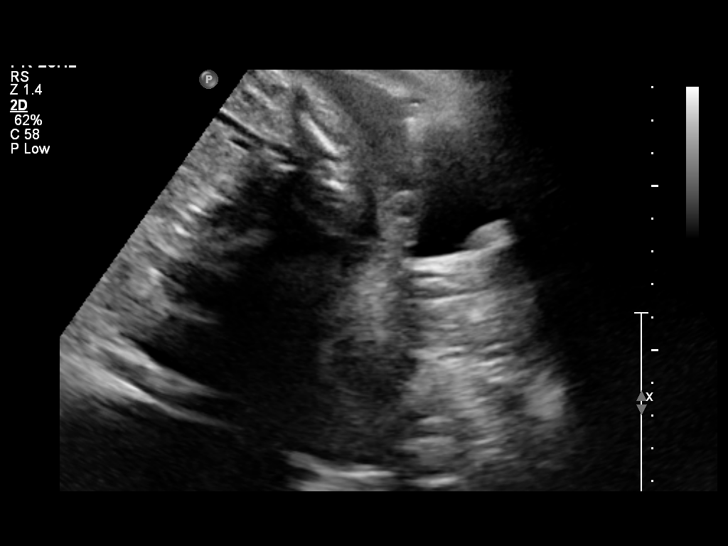
[im 10/84]
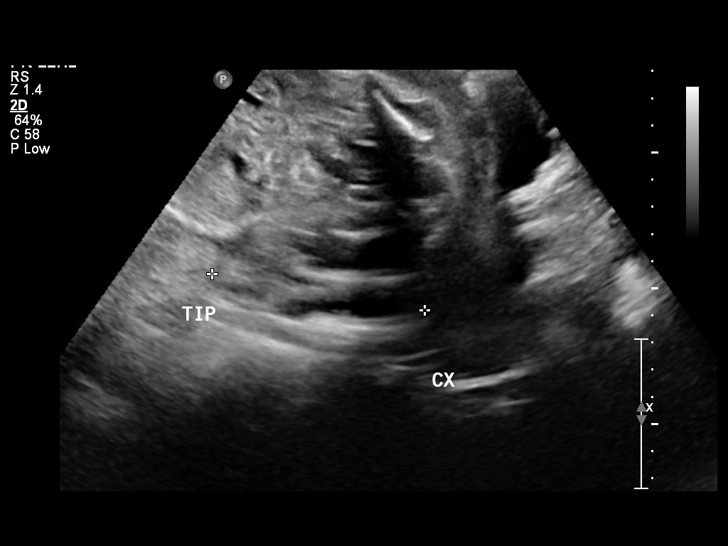
[im 16/84]
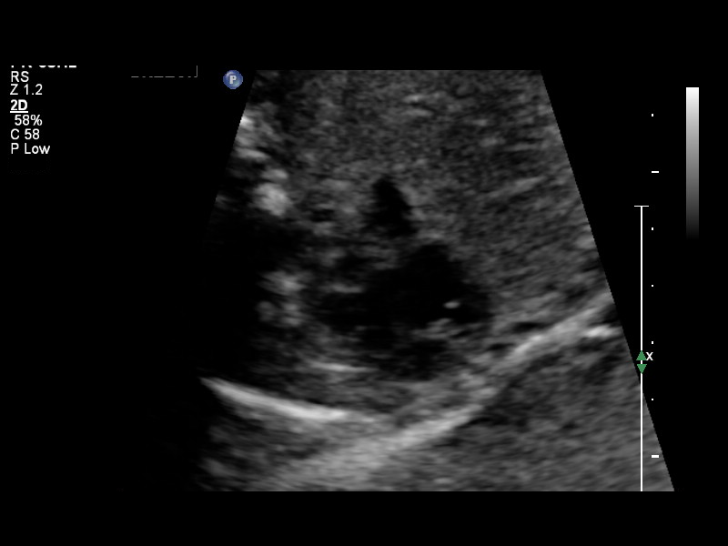
[im 25/84]
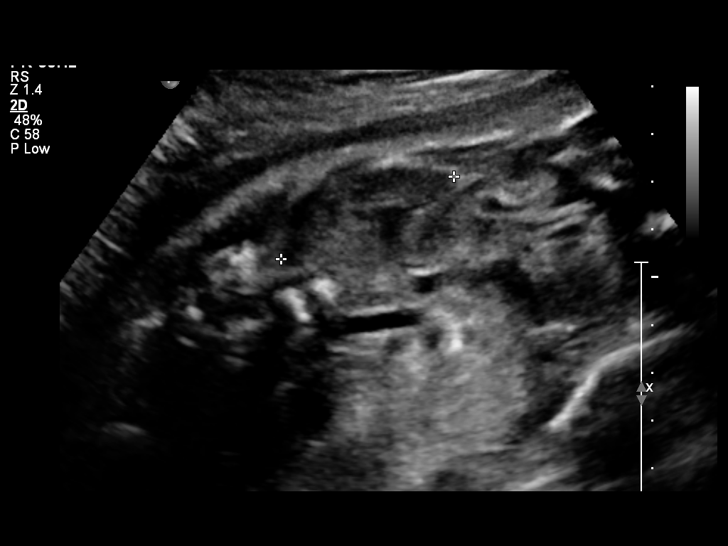
[im 31/84]
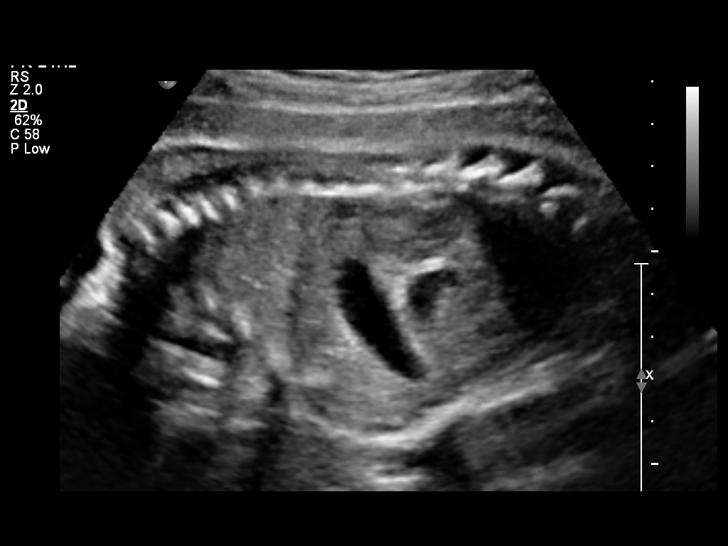
[im 37/84]
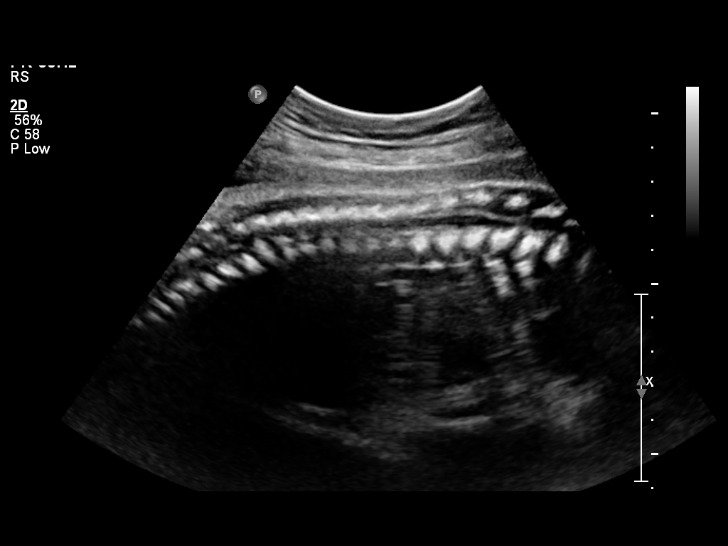
[im 47/84]
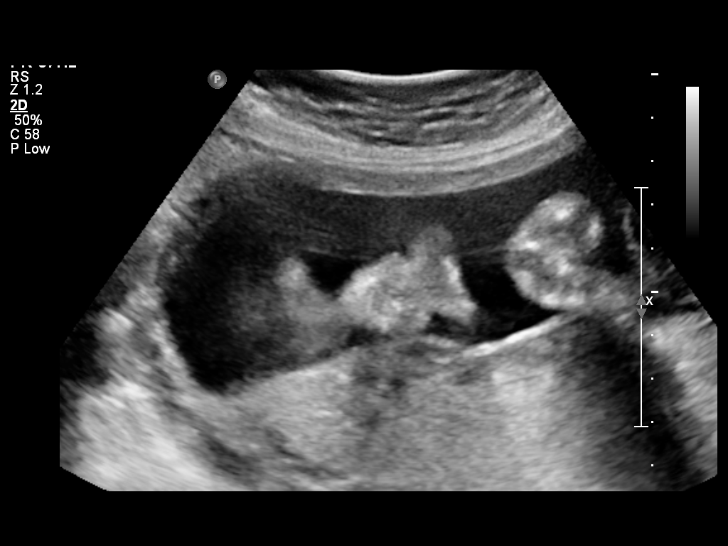
[im 53/84]
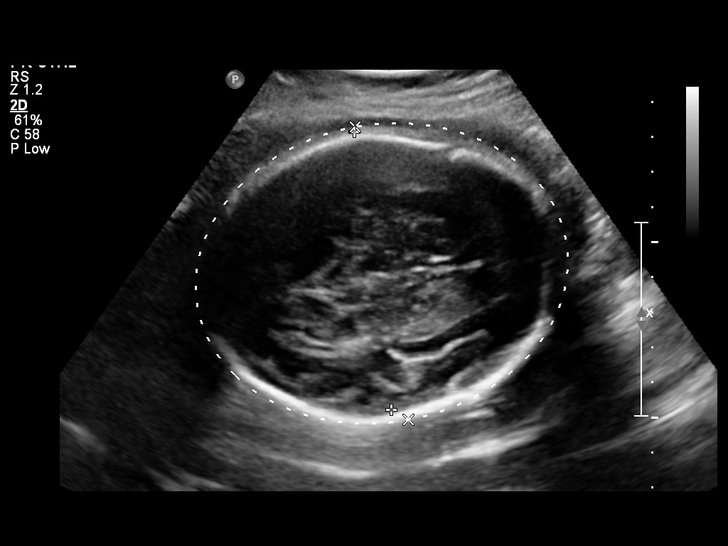
[im 59/84]
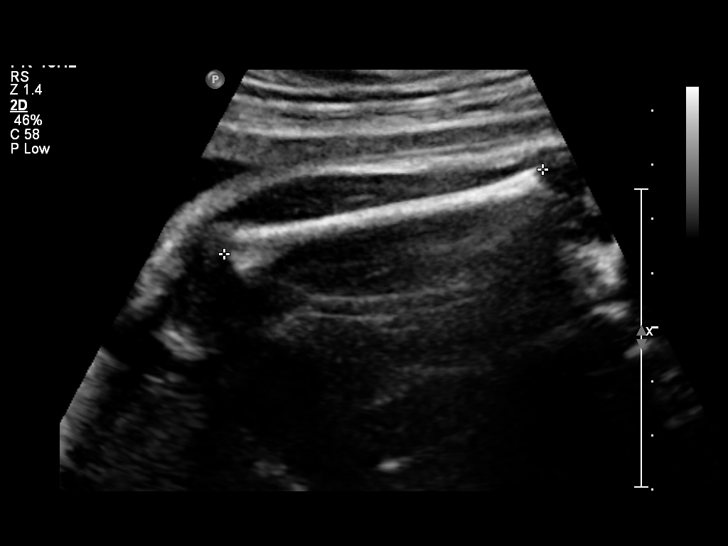
[im 68/84]
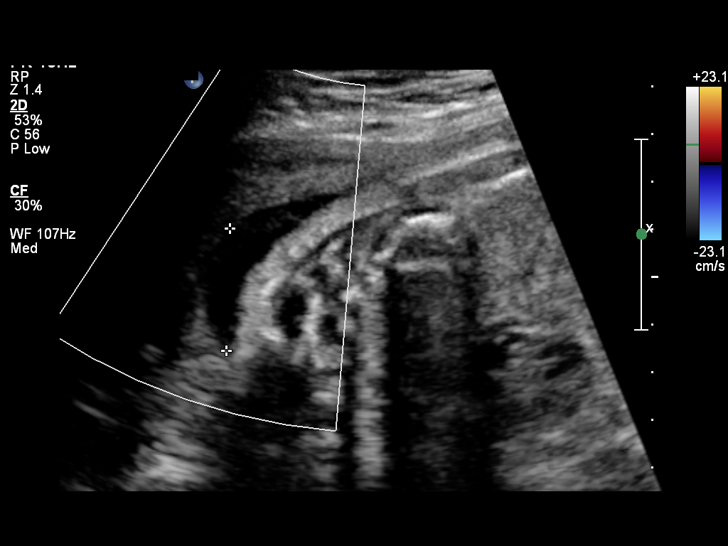
[im 74/84]
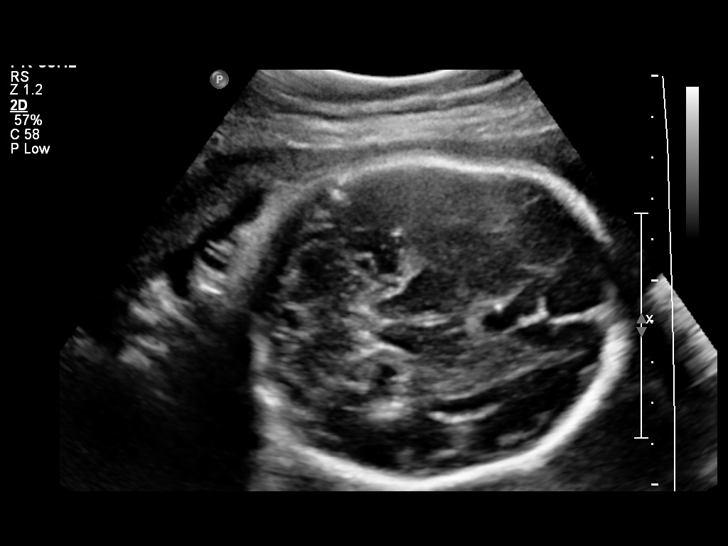
[im 80/84]
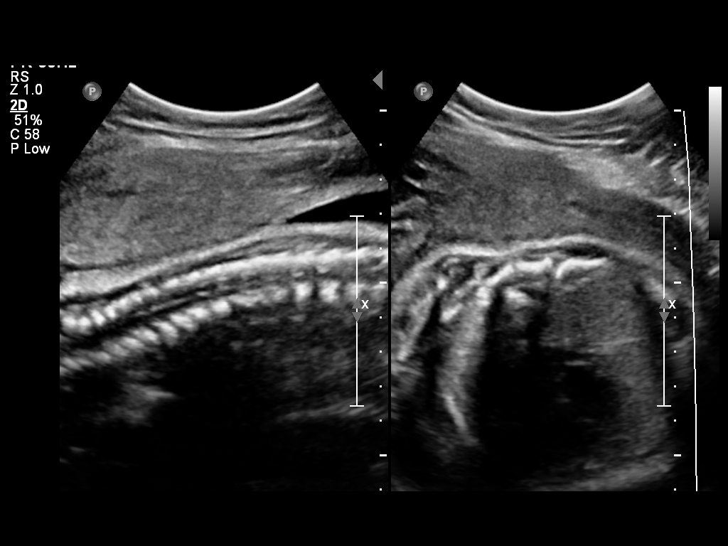

[12 of 28 positions shown; findings below may reference images not displayed]

OBSTETRICS REPORT
                      (Signed Final 08/25/2013 [DATE])

Service(s) Provided

 US OB FOLLOW UP                                       76816.1
Indications

 Size less than dates (Small for gestational [AGE]
 FGR)
Fetal Evaluation

 Num Of Fetuses:    1
 Fetal Heart Rate:  138                          bpm
 Cardiac Activity:  Observed
 Presentation:      Breech
 Placenta:          Posterior Fundal, above
                    cervical os
 P. Cord            Previously Visualized
 Insertion:

 Amniotic Fluid
 AFI FV:      Subjectively within normal limits
 AFI Sum:     13.8    cm       45  %Tile     Larg Pckt:       5  cm
 RUQ:   2.7     cm   RLQ:    3      cm    LUQ:   5       cm   LLQ:    3.1    cm
Biometry

 BPD:     79.7  mm     G. Age:  32w 0d                CI:        72.47   70 - 86
                                                      FL/HC:      20.1   19.1 -

 HC:     297.8  mm     G. Age:  33w 0d       47  %    HC/AC:      1.07   0.96 -

 AC:     277.9  mm     G. Age:  31w 6d       52  %    FL/BPD:     75.3   71 - 87
 FL:        60  mm     G. Age:  31w 1d       24  %    FL/AC:      21.6   20 - 24
 HUM:       54  mm     G. Age:  31w 3d       45  %

 Est. FW:    7951  gm      4 lb 1 oz     57  %
Gestational Age

 LMP:           27w 5d        Date:  02/12/13                 EDD:   11/19/13
 U/S Today:     32w 0d                                        EDD:   10/20/13
 Best:          31w 5d     Det. By:  U/S (05/13/13)           EDD:   10/22/13
Anatomy
 Cranium:          Appears normal         Aortic Arch:      Previously seen
 Fetal Cavum:      Appears normal         Ductal Arch:      Previously seen
 Ventricles:       Appears normal         Diaphragm:        Previously seen
 Choroid Plexus:   Previously seen        Stomach:          Appears normal
 Cerebellum:       Previously seen        Abdomen:          Appears normal
 Posterior Fossa:  Previously seen        Abdominal Wall:   Previously seen
 Nuchal Fold:      Previously seen        Cord Vessels:     Previously seen
 Face:             Orbits and profile     Kidneys:          Appear normal
                   previously seen
 Lips:             Appears normal         Bladder:          Appears normal
 Heart:            Appears normal         Spine:            Previously seen
                   (4CH, axis, and
                   situs)
 RVOT:             Previously seen        Lower             Previously seen
                                          Extremities:
 LVOT:             Previously seen        Upper             Previously seen
                                          Extremities:

 Other:  Heels and 5th digit previously seen. Nasal bone previously visualized.
Targeted Anatomy

 Fetal Central Nervous System
 Lat. Ventricles:  4.3                    Cisterna Magna:
Cervix Uterus Adnexa

 Cervical Length:    3.61     cm

 Cervix:       Normal appearance by transabdominal scan.
 Uterus:       No abnormality visualized.
 Cul De Sac:   No free fluid seen.

 Left Ovary:    Not visualized.
 Right Ovary:   Not visualized.

 Adnexa:     No adnexal mass visualized.
Impression

 Single IUP at 31 [DATE] weeks
 Fetal growth is appropriate (57th %tile)
 Normal interval anatomy
 Posterior fundal placenta
 Normal amniotic fluid volume
Recommendations

 Follow-up ultrasounds as clinically indicated.

 questions or concerns.

## 2015-05-24 IMAGING — US US PELVIS COMPLETE
1 series · 13 of 25 positions shown · non-contrast
Comparison: All prior pelvic ultrasounds were OB ultrasound
examinations.

CLINICAL DATA: 22-year-old G6 P3 SAB3, LMP 12/31/2013, with lower
abdominal pain and pelvic pain. Evaluate for intrauterine device
positioning.

EXAM:
TRANSABDOMINAL AND TRANSVAGINAL ULTRASOUND OF PELVIS
TECHNIQUE: Both transabdominal and transvaginal ultrasound examinations of the
pelvis were performed. Transabdominal technique was performed for
global imaging of the pelvis including uterus, ovaries, adnexal
regions, and pelvic cul-de-sac. It was necessary to proceed with
endovaginal exam following the transabdominal exam to visualize the
endometrium and ovaries as the bladder was incompletely distended..

[Series 1: us pelvis complete · 13 of 42 slices shown]
[im 1/42]
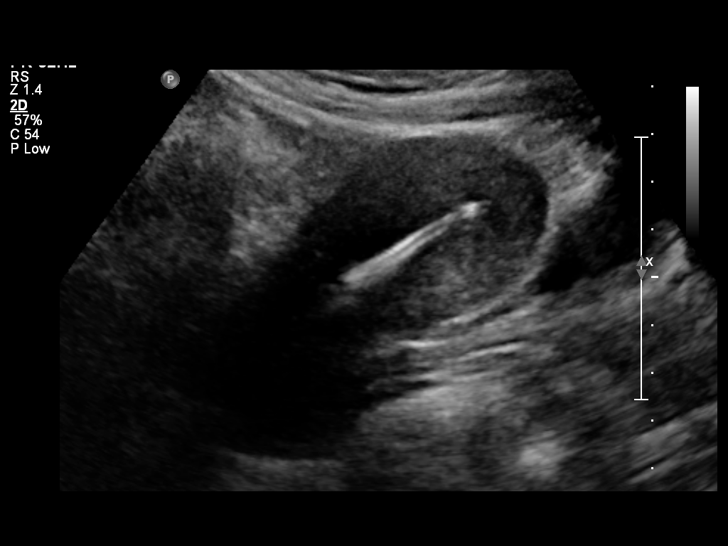
[im 4/42]
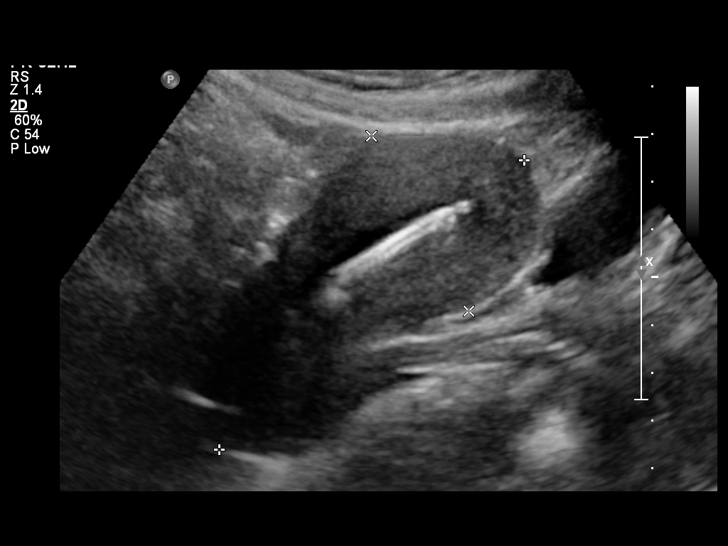
[im 7/42]
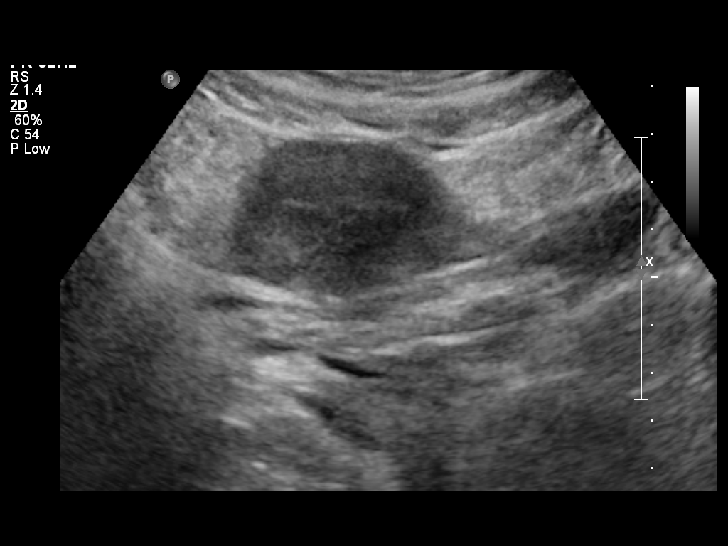
[im 11/42]
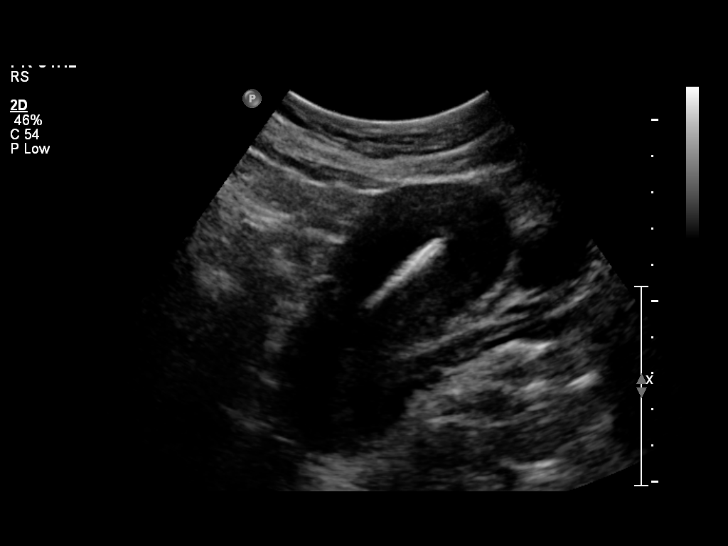
[im 14/42]
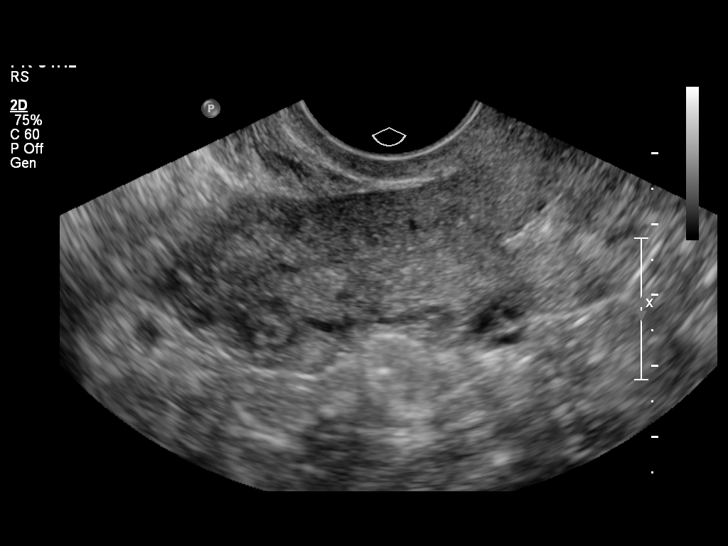
[im 18/42]
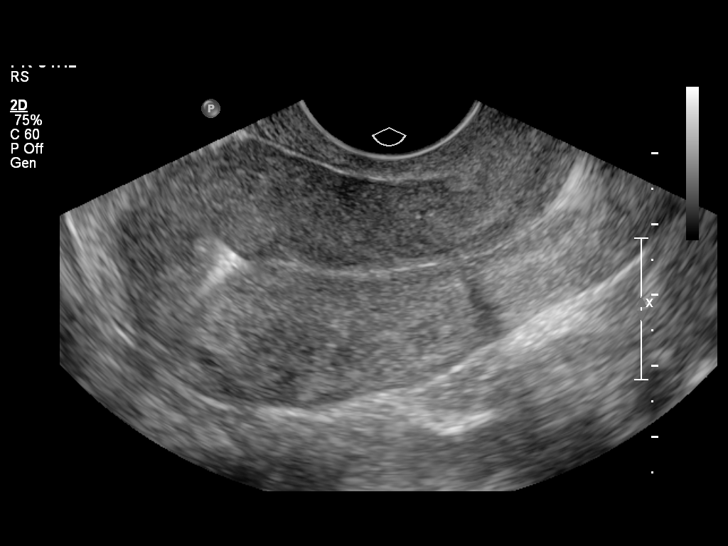
[im 21/42]
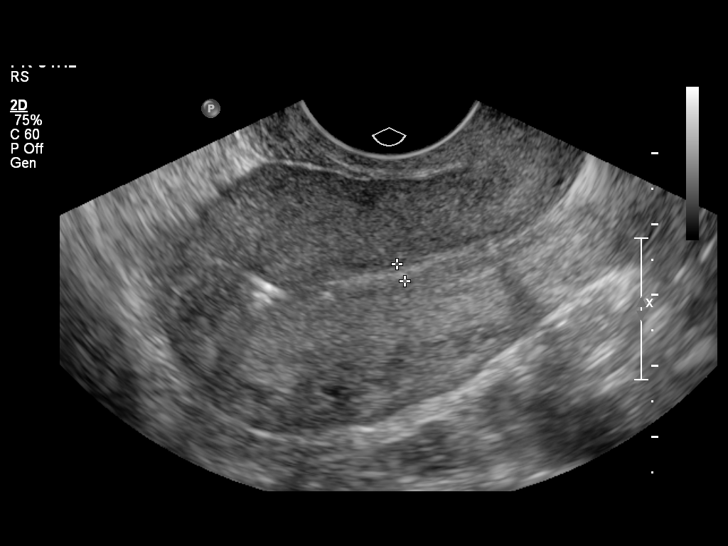
[im 24/42]
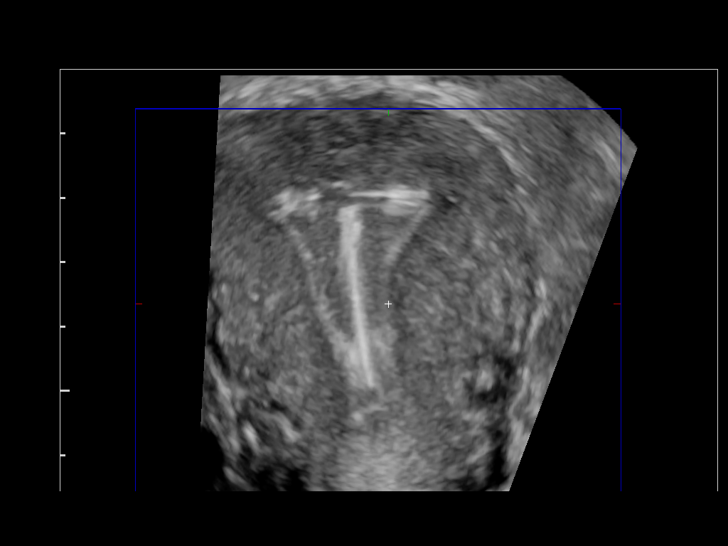
[im 28/42]
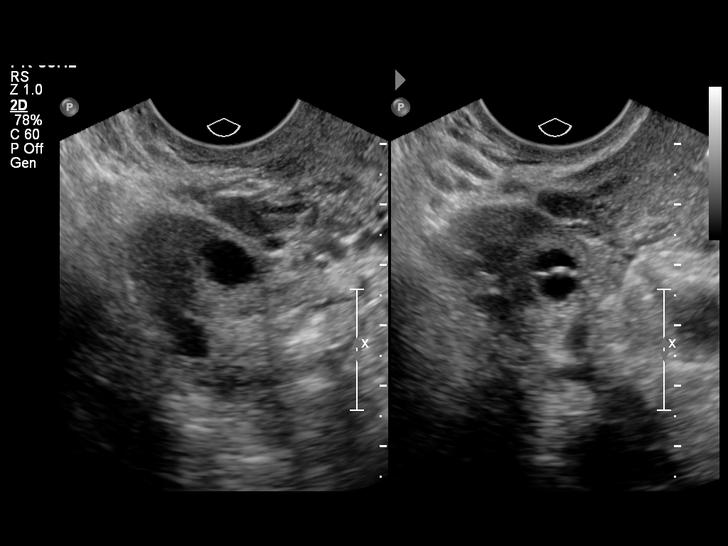
[im 31/42]
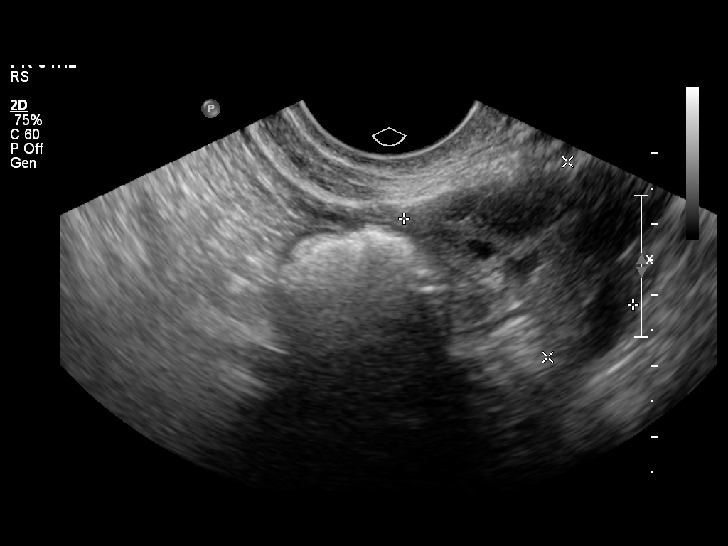
[im 35/42]
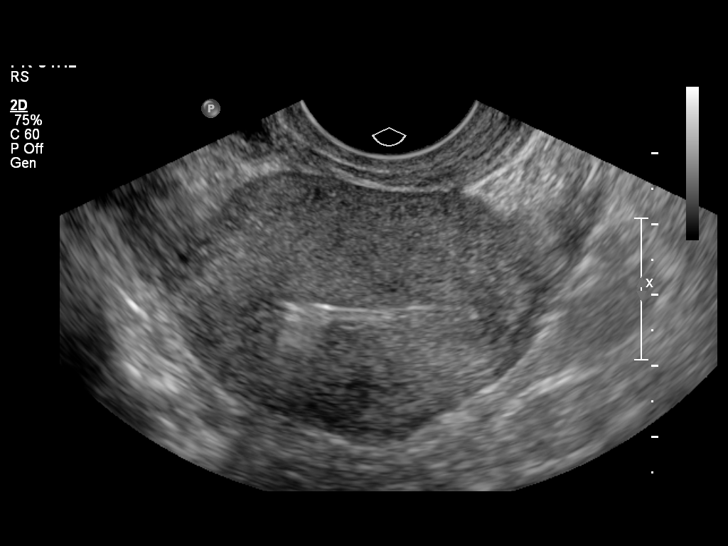
[im 38/42]
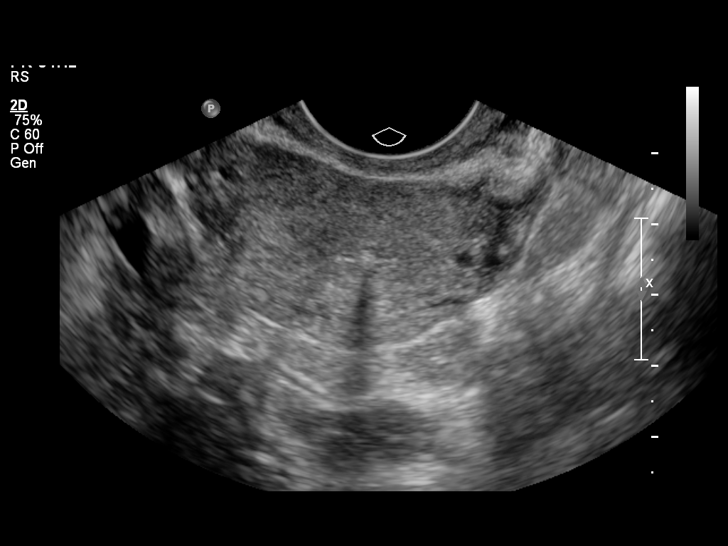
[im 42/42]
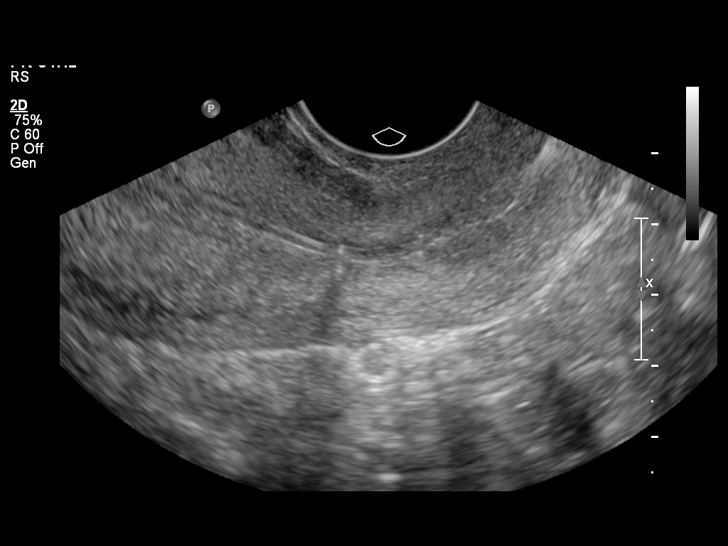

[13 of 25 positions shown; findings below may reference images not displayed]

FINDINGS: Uterus

Measurements: Approximately 8.7 x 3.9 x 5.7 cm. Homogeneous
echotexture without focal fibroid or other myometrial abnormality.
Normal-appearing uterine cervix.

Endometrium

Thickness: Approximately 3 mm. Normal appearance without evidence of
endometrial fluid or mass. Intrauterine device appropriately
positioned in the fundal endometrium without evidence of extrusion.

Right ovary

Measurements: Approximately 4.3 x 2.4 x 2.7 cm. Small follicular
cysts. No dominant cyst or solid mass. Normal color Doppler flow
within the ovary.

Left ovary

Measurements: Approximately 3.5 x 2.8 x 2.3 cm. Small follicular
cysts. No dominant cyst or solid mass. Normal color Doppler flow
within the ovary.

Other findings

No free fluid.
IMPRESSION: Normal examination. Specifically, intrauterine device appropriately
positioned within the fundal endometrium and there is no evidence of
extrusion of the device.
